# Patient Record
Sex: Female | Born: 1950
Health system: Southern US, Community
[De-identification: ages and names within clinical notes are randomized; demographics above are authoritative.]

## PROBLEM LIST (undated history)

## (undated) DIAGNOSIS — I10 Essential (primary) hypertension: Secondary | ICD-10-CM

## (undated) HISTORY — PX: PARTIAL HYSTERECTOMY: SHX80

## (undated) HISTORY — DX: Essential (primary) hypertension: I10

---

## 2001-10-26 ENCOUNTER — Other Ambulatory Visit: Admission: RE | Admit: 2001-10-26 | Discharge: 2001-10-26 | Payer: Self-pay | Admitting: Family Medicine

## 2003-03-20 ENCOUNTER — Other Ambulatory Visit: Admission: RE | Admit: 2003-03-20 | Discharge: 2003-03-20 | Payer: Self-pay | Admitting: Family Medicine

## 2003-04-10 ENCOUNTER — Inpatient Hospital Stay (HOSPITAL_COMMUNITY): Admission: AC | Admit: 2003-04-10 | Discharge: 2003-04-12 | Payer: Self-pay

## 2003-07-30 ENCOUNTER — Emergency Department (HOSPITAL_COMMUNITY): Admission: EM | Admit: 2003-07-30 | Discharge: 2003-07-30 | Payer: Self-pay | Admitting: Emergency Medicine

## 2005-09-16 ENCOUNTER — Other Ambulatory Visit: Admission: RE | Admit: 2005-09-16 | Discharge: 2005-09-16 | Payer: Self-pay | Admitting: Family Medicine

## 2007-09-08 ENCOUNTER — Encounter: Admission: RE | Admit: 2007-09-08 | Discharge: 2007-09-08 | Payer: Self-pay | Admitting: Family Medicine

## 2007-09-21 ENCOUNTER — Other Ambulatory Visit: Admission: RE | Admit: 2007-09-21 | Discharge: 2007-09-21 | Payer: Self-pay | Admitting: Family Medicine

## 2007-11-14 ENCOUNTER — Ambulatory Visit: Payer: Self-pay | Admitting: Family Medicine

## 2007-11-14 DIAGNOSIS — I10 Essential (primary) hypertension: Secondary | ICD-10-CM | POA: Insufficient documentation

## 2007-11-14 DIAGNOSIS — E119 Type 2 diabetes mellitus without complications: Secondary | ICD-10-CM | POA: Insufficient documentation

## 2007-11-14 DIAGNOSIS — R011 Cardiac murmur, unspecified: Secondary | ICD-10-CM

## 2007-12-19 ENCOUNTER — Ambulatory Visit: Payer: Self-pay | Admitting: Family Medicine

## 2007-12-21 LAB — CONVERTED CEMR LAB
ALT: 23 units/L (ref 0–35)
AST: 21 units/L (ref 0–37)
Albumin: 4.1 g/dL (ref 3.5–5.2)
BUN: 10 mg/dL (ref 6–23)
CO2: 30 meq/L (ref 19–32)
Chloride: 106 meq/L (ref 96–112)
Cholesterol: 180 mg/dL (ref 0–200)
Creatinine, Ser: 0.7 mg/dL (ref 0.4–1.2)
Creatinine,U: 314.8 mg/dL
GFR calc non Af Amer: 92 mL/min
HDL: 76.5 mg/dL (ref >39.0)
LDL Cholesterol: 87 mg/dL (ref 0–99)
Triglycerides: 81 mg/dL (ref 0–149)

## 2008-02-20 LAB — HM DIABETES EYE EXAM: HM Diabetic Eye Exam: NORMAL

## 2008-02-21 ENCOUNTER — Ambulatory Visit: Payer: Self-pay | Admitting: Family Medicine

## 2008-05-21 ENCOUNTER — Ambulatory Visit: Payer: Self-pay | Admitting: Family Medicine

## 2008-05-23 ENCOUNTER — Ambulatory Visit: Payer: Self-pay | Admitting: Family Medicine

## 2008-08-10 ENCOUNTER — Ambulatory Visit: Payer: Self-pay | Admitting: Family Medicine

## 2008-08-10 LAB — CONVERTED CEMR LAB
ALT: 20 units/L (ref 0–35)
AST: 19 units/L (ref 0–37)
Albumin: 4 g/dL (ref 3.5–5.2)
BUN: 12 mg/dL (ref 6–23)
CO2: 28 meq/L (ref 19–32)
Chloride: 105 meq/L (ref 96–112)
Cholesterol: 202 mg/dL — ABNORMAL HIGH (ref 0–200)
Glucose, Bld: 93 mg/dL (ref 70–99)
LDL Cholesterol: 105 mg/dL
Potassium: 3.7 meq/L (ref 3.5–5.1)
Total CHOL/HDL Ratio: 3
Total Protein: 7.3 g/dL (ref 6.0–8.3)

## 2008-08-28 ENCOUNTER — Ambulatory Visit: Payer: Self-pay | Admitting: Family Medicine

## 2008-08-28 DIAGNOSIS — E7800 Pure hypercholesterolemia, unspecified: Secondary | ICD-10-CM

## 2008-08-28 LAB — CONVERTED CEMR LAB
Cholesterol, target level: 200 mg/dL
LDL Goal: 100 mg/dL

## 2008-09-17 ENCOUNTER — Encounter: Payer: Self-pay | Admitting: Family Medicine

## 2008-09-20 ENCOUNTER — Encounter: Payer: Self-pay | Admitting: Family Medicine

## 2008-09-24 ENCOUNTER — Encounter (INDEPENDENT_AMBULATORY_CARE_PROVIDER_SITE_OTHER): Payer: Self-pay | Admitting: *Deleted

## 2008-09-24 DIAGNOSIS — M8580 Other specified disorders of bone density and structure, unspecified site: Secondary | ICD-10-CM

## 2008-12-21 ENCOUNTER — Ambulatory Visit: Payer: Self-pay | Admitting: Family Medicine

## 2008-12-21 LAB — CONVERTED CEMR LAB
Albumin: 4 g/dL (ref 3.5–5.2)
Alkaline Phosphatase: 70 units/L (ref 39–117)
Calcium: 9.2 mg/dL (ref 8.4–10.5)
GFR calc non Af Amer: 94.91 mL/min (ref >60)
Glucose, Bld: 114 mg/dL — ABNORMAL HIGH (ref 70–99)
HDL: 67.6 mg/dL (ref >39.00)
Hgb A1c MFr Bld: 6.3 % (ref 4.6–6.5)
Sodium: 142 meq/L (ref 135–145)
VLDL: 22 mg/dL (ref 0.0–40.0)

## 2008-12-26 ENCOUNTER — Ambulatory Visit: Payer: Self-pay | Admitting: Family Medicine

## 2009-05-21 ENCOUNTER — Ambulatory Visit: Payer: Self-pay | Admitting: Family Medicine

## 2009-05-22 LAB — CONVERTED CEMR LAB
ALT: 24 units/L (ref 0–35)
AST: 22 units/L (ref 0–37)
Alkaline Phosphatase: 60 units/L (ref 39–117)
BUN: 13 mg/dL (ref 6–23)
Bilirubin, Direct: 0 mg/dL (ref 0.0–0.3)
Chloride: 105 meq/L (ref 96–112)
Potassium: 4 meq/L (ref 3.5–5.1)
Sodium: 144 meq/L (ref 135–145)
Total Bilirubin: 0.4 mg/dL (ref 0.3–1.2)
Total CHOL/HDL Ratio: 3
VLDL: 22.8 mg/dL (ref 0.0–40.0)

## 2009-06-07 ENCOUNTER — Ambulatory Visit: Payer: Self-pay | Admitting: Family Medicine

## 2009-06-07 LAB — HM DIABETES FOOT EXAM

## 2009-06-13 ENCOUNTER — Encounter: Payer: Self-pay | Admitting: Family Medicine

## 2009-09-18 ENCOUNTER — Encounter: Payer: Self-pay | Admitting: Family Medicine

## 2009-09-20 LAB — HM MAMMOGRAPHY: HM Mammogram: NORMAL

## 2009-12-18 ENCOUNTER — Telehealth (INDEPENDENT_AMBULATORY_CARE_PROVIDER_SITE_OTHER): Payer: Self-pay | Admitting: *Deleted

## 2009-12-20 ENCOUNTER — Ambulatory Visit: Payer: Self-pay | Admitting: Family Medicine

## 2009-12-20 LAB — CONVERTED CEMR LAB
Albumin: 3.9 g/dL (ref 3.5–5.2)
Alkaline Phosphatase: 66 units/L (ref 39–117)
Bilirubin, Direct: 0 mg/dL (ref 0.0–0.3)
Calcium: 9.4 mg/dL (ref 8.4–10.5)
Creatinine, Ser: 0.7 mg/dL (ref 0.4–1.2)
GFR calc non Af Amer: 103.48 mL/min (ref >60)
HDL: 69 mg/dL (ref >39.00)
LDL Cholesterol: 105 mg/dL — ABNORMAL HIGH (ref 0–99)
Sodium: 141 meq/L (ref 135–145)
Total CHOL/HDL Ratio: 3
VLDL: 21.2 mg/dL (ref 0.0–40.0)

## 2010-01-01 ENCOUNTER — Ambulatory Visit: Payer: Self-pay | Admitting: Family Medicine

## 2010-01-01 LAB — HM SIGMOIDOSCOPY

## 2010-01-01 LAB — HM PAP SMEAR

## 2010-03-15 ENCOUNTER — Encounter: Payer: Self-pay | Admitting: Family Medicine

## 2010-03-17 ENCOUNTER — Encounter: Payer: Self-pay | Admitting: Family Medicine

## 2010-03-25 ENCOUNTER — Encounter: Payer: Self-pay | Admitting: Family Medicine

## 2010-03-25 NOTE — Progress Notes (Signed)
----   Converted from flag ---- ---- 12/17/2009 11:17 PM, Kerby Nora MD wrote: CMET, Lipids, Dx 272.0  ---- 12/12/2009 11:52 AM, Liane Comber CMA (AAMA) wrote: Lab orders please! Good Morning! This pt is scheduled for cpx labs Friday, which labs to draw and dx codes to use? Thanks Tasha ------------------------------

## 2010-03-25 NOTE — Assessment & Plan Note (Signed)
Summary: F/U/CLE   Vital Signs:  Patient profile:   60 year old female Weight:      173 pounds Temp:     98.1 degrees F oral Pulse rate:   64 / minute Pulse rhythm:   regular BP sitting:   130 / 80  (left arm) Cuff size:   regular  Vitals Entered By: Lowella Petties CMA (June 07, 2009 4:11 PM) CC: follow-up visit, Hypertension Management, Lipid Management   History of Present Illness: DM, well controlled on current regimen.   Hypertension History:      She denies headache, chest pain, palpitations, dyspnea with exertion, orthopnea, peripheral edema, neurologic problems, and side effects from treatment.  Well controlled. Marland Kitchen        Positive major cardiovascular risk factors include female age 50 years old or older, diabetes, hyperlipidemia, and hypertension.  Negative major cardiovascular risk factors include non-tobacco-user status.    Lipid Management History:      Positive NCEP/ATP III risk factors include female age 82 years old or older, diabetes, and hypertension.  Negative NCEP/ATP III risk factors include HDL cholesterol greater than 60 and non-tobacco-user status.        Her compliance with the TLC diet is poor.  The patient expresses understanding of adjunctive measures for cholesterol lowering.  Adjunctive measures started by the patient include aerobic exercise, fiber, and weight reduction.      Problems Prior to Update: 1)  Osteopenia  (ICD-733.90) 2)  Preventive Health Care  (ICD-V70.0) 3)  Special Screening For Osteoporosis  (ICD-V82.81) 4)  Other Screening Mammogram  (ICD-V76.12) 5)  Hypercholesterolemia  (ICD-272.0) 6)  Diabetes Mellitus, Type II  (ICD-250.00) 7)  Cardiac Murmur, Benign  (ICD-785.2) 8)  Hypertension  (ICD-401.9)  Current Medications (verified): 1)  Adult Aspirin Low Strength 81 Mg Tbdp (Aspirin) .... Take One By Mouth Daily 2)  Lisinopril-Hydrochlorothiazide 20-12.5 Mg Tabs (Lisinopril-Hydrochlorothiazide) .Marland Kitchen.. 1 Tab By Mouth Daily 3)   Multi-Vitamin  Tabs (Multiple Vitamin) .... Take One By Mouth Dialy 4)  Calcium Carbonate-Vitamin D 600-400 Mg-Unit  Tabs (Calcium Carbonate-Vitamin D) .... Take 1 Tablet By Mouth Once A Day 5)  Fish Oil   Oil (Fish Oil) .... Take 1 Capsule By Mouth Once A Day 6)  Accu Check Aviva .... Test Strips To Check Blood Sugar Daily Dx 250.00 7)  Alendronate Sodium 70 Mg Tabs (Alendronate Sodium) .Marland Kitchen.. 1 Tab By Mouth Every Week  Allergies (verified): No Known Drug Allergies  Past History:  Past medical, surgical, family and social histories (including risk factors) reviewed, and no changes noted (except as noted below).  Past Medical History: Reviewed history from 11/14/2007 and no changes required. Hypertension  Past Surgical History: Reviewed history from 11/14/2007 and no changes required. Hysterectomy partial, has left ovary: tubal pregnancy  Family History: Reviewed history from 11/14/2007 and no changes required. father: CAD, MI age 51, HTN, DVT mother:  DV, HTN, osteoarthritis siblings:HTN sisteR: heart palpitations no cencer  Social History: Reviewed history from 11/14/2007 and no changes required. Occupation: teaches kindergarten Married no children Never Smoked Alcohol use-no Drug use-no Regular exercise-yes, walks daily Diet: fruits and veggies  Review of Systems General:  Denies fatigue and fever. CV:  Denies chest pain or discomfort. Resp:  Denies shortness of breath. GI:  Denies abdominal pain, bloody stools, constipation, and diarrhea. GU:  Denies dysuria.  Physical Exam  General:  Overweight appearing female in NAD Mouth:  Oral mucosa and oropharynx without lesions or exudates.  Teeth in good repair.  Neck:  no carotid bruit or thyromegaly no cervical or supraclavicular lymphadenopathy  Lungs:  Normal respiratory effort, chest expands symmetrically. Lungs are clear to auscultation, no crackles or wheezes. Heart:  Normal rate and regular rhythm. S1 and S2  normal without gallop, murmur, click, rub or other extra sounds. Abdomen:  Bowel sounds positive,abdomen soft and non-tender without masses, organomegaly or hernias noted. Pulses:  R and L posterior tibial pulses are full and equal bilaterally  Extremities:  noedema  Diabetes Management Exam:    Foot Exam (with socks and/or shoes not present):       Sensory-Pinprick/Light touch:          Left medial foot (L-4): normal          Left dorsal foot (L-5): normal          Left lateral foot (S-1): normal          Right medial foot (L-4): normal          Right dorsal foot (L-5): normal          Right lateral foot (S-1): normal       Sensory-Monofilament:          Left foot: normal          Right foot: normal       Inspection:          Left foot: normal          Right foot: normal       Nails:          Left foot: normal          Right foot: normal   Impression & Recommendations:  Problem # 1:  DIABETES MELLITUS, TYPE II (ICD-250.00)  Well controlled with lifestyle.  Her updated medication list for this problem includes:    Adult Aspirin Low Strength 81 Mg Tbdp (Aspirin) .Marland Kitchen... Take one by mouth daily    Lisinopril-hydrochlorothiazide 20-12.5 Mg Tabs (Lisinopril-hydrochlorothiazide) .Marland Kitchen... 1 tab by mouth daily  Labs Reviewed: Creat: 0.7 (05/21/2009)     Last Eye Exam: normal (02/20/2008) Reviewed HgBA1c results: 6.3 (05/21/2009)  6.3 (12/21/2008)  Problem # 2:  HYPERCHOLESTEROLEMIA (ICD-272.0) Inadequate control...make diet and lifestyle cahnges, regular exercise and start red ueast rice 2400 mg daily. Goal LDL <100.  Labs Reviewed: SGOT: 22 (05/21/2009)   SGPT: 24 (05/21/2009)  Lipid Goals: Chol Goal: 200 (08/28/2008)   HDL Goal: 40 (08/28/2008)   LDL Goal: 100 (08/28/2008)   TG Goal: 150 (08/28/2008)  10 Yr Risk Heart Disease: 11 % Prior 10 Yr Risk Heart Disease: 7 % (12/26/2008)   HDL:74.40 (05/21/2009), 67.60 (12/21/2008)  LDL:105 (12/21/2008), 105 (08/10/2008)  Chol:201  (05/21/2009), 195 (12/21/2008)  Trig:114.0 (05/21/2009), 110.0 (12/21/2008)  Problem # 3:  HYPERTENSION (ICD-401.9) Well controlled. Continue current medication.  Her updated medication list for this problem includes:    Lisinopril-hydrochlorothiazide 20-12.5 Mg Tabs (Lisinopril-hydrochlorothiazide) .Marland Kitchen... 1 tab by mouth daily  BP today: 130/80 Prior BP: 110/70 (12/26/2008)  10 Yr Risk Heart Disease: 11 % Prior 10 Yr Risk Heart Disease: 7 % (12/26/2008)  Labs Reviewed: K+: 4.0 (05/21/2009) Creat: : 0.7 (05/21/2009)   Chol: 201 (05/21/2009)   HDL: 74.40 (05/21/2009)   LDL: 105 (12/21/2008)   TG: 114.0 (05/21/2009)  Complete Medication List: 1)  Adult Aspirin Low Strength 81 Mg Tbdp (Aspirin) .... Take one by mouth daily 2)  Lisinopril-hydrochlorothiazide 20-12.5 Mg Tabs (Lisinopril-hydrochlorothiazide) .Marland Kitchen.. 1 tab by mouth daily 3)  Multi-vitamin Tabs (Multiple vitamin) .... Take one by mouth  dialy 4)  Calcium Carbonate-vitamin D 600-400 Mg-unit Tabs (Calcium carbonate-vitamin d) .... Take 1 tablet by mouth once a day 5)  Fish Oil Oil (Fish oil) .... Take 1 capsule by mouth once a day 6)  Accu Check Aviva  .... Test strips to check blood sugar daily dx 250.00 7)  Alendronate Sodium 70 Mg Tabs (Alendronate sodium) .Marland Kitchen.. 1 tab by mouth every week 8)  Accu-chek Multiclix Lancet Dev Kit (Lancets misc.) .... Use as directed  Hypertension Assessment/Plan:      The patient's hypertensive risk group is category C: Target organ damage and/or diabetes.  Her calculated 10 year risk of coronary heart disease is 11 %.  Today's blood pressure is 130/80.  Her blood pressure goal is < 130/80.  Lipid Assessment/Plan:      Based on NCEP/ATP III, the patient's risk factor category is "history of diabetes".  The patient's lipid goals are as follows: Total cholesterol goal is 200; LDL cholesterol goal is 100; HDL cholesterol goal is 40; Triglyceride goal is 150.  Her LDL cholesterol goal has not been met.   Secondary causes for hyperlipidemia have been ruled out.  She has been counseled on adjunctive measures for lowering her cholesterol and has been provided with dietary instructions.    Patient Instructions: 1)  Work on  exercise, weight loss, healthy eating habits, low cholesterol diet. 2)   Start red yeast rice 2400 mg daily for cholesterol.  3)  Schedule CPX in 3 months  4)  BMP prior to visit, ICD-9: 250.00 5)  Hepatic Panel prior to visit ICD-9:  6)  HgBA1c prior to visit  ICD-9:  7)  Urine Microalbumin prior to visit ICD-9 :  Prescriptions: LISINOPRIL-HYDROCHLOROTHIAZIDE 20-12.5 MG TABS (LISINOPRIL-HYDROCHLOROTHIAZIDE) 1 tab by mouth daily  #90 x 3   Entered and Authorized by:   Kerby Nora MD   Signed by:   Kerby Nora MD on 06/07/2009   Method used:   [facility]        [facility] [id number]ret[facility]       [address]       [place], [place]  [address]       Ph: [phone] or [phone]       Fax: [phone]   RxID:   [id number]   Prior Medications (reviewed today): ADULT ASPIRIN LOW STRENGTH 81 MG TBDP (ASPIRIN) take one by mouth daily LISINOPRIL-HYDROCHLOROTHIAZIDE 20-12.5 MG TABS (LISINOPRIL-HYDROCHLOROTHIAZIDE) 1 tab by mouth daily MULTI-VITAMIN  TABS (MULTIPLE VITAMIN) take one by mouth dialy CALCIUM CARBONATE-VITAMIN D 600-400 MG-UNIT  TABS (CALCIUM CARBONATE-VITAMIN D) Take 1 tablet by mouth once a day FISH OIL   OIL (FISH OIL) Take 1 capsule by mouth once a day ACCU CHECK AVIVA () Test strips to check blood sugar daily Dx 250.00 ALENDRONATE SODIUM 70 MG TABS (ALENDRONATE SODIUM) 1 tab by mouth every week ACCU-CHEK MULTICLIX LANCET DEV  KIT (LANCETS MISC.) Use as directed Current Allergies (reviewed today): No known allergies

## 2010-03-25 NOTE — Miscellaneous (Signed)
Summary: lancet devise kit  Clinical Lists Changes  Medications: Added new medication of ACCU-CHEK MULTICLIX LANCET DEV  KIT (LANCETS MISC.) Use as directed - Signed Rx of ACCU-CHEK MULTICLIX LANCET DEV  KIT (LANCETS MISC.) Use as directed;  #1 x 0;  Signed;  Entered by: Lowella Petties CMA;  Authorized by: Kerby Nora MD;  Method used: [facility] [place], [place]  [address], Ph: [phone] or [phone], Fax: [phone]    Prescriptions: ACCU-CHEK MULTICLIX LANCET DEV  KIT (LANCETS MISC.) Use as directed  #1 x 0   Entered by:   Lowella Petties CMA   Authorized by:   Kerby Nora MD   Signed by:   Lowella Petties CMA on 06/13/2009   Method used:   Telephoned to ...       [facility] [id number]       [address]       [place], [place]  [address]       Ph: [phone] or [phone]       Fax: [phone]   RxID:   [id number]   Prior Medications: ADULT ASPIRIN LOW STRENGTH 81 MG TBDP (ASPIRIN) take one by mouth daily LISINOPRIL-HYDROCHLOROTHIAZIDE 20-12.5 MG TABS (LISINOPRIL-HYDROCHLOROTHIAZIDE) 1 tab by mouth daily MULTI-VITAMIN  TABS (MULTIPLE VITAMIN) take one by mouth dialy CALCIUM CARBONATE-VITAMIN D 600-400 MG-UNIT  TABS (CALCIUM CARBONATE-VITAMIN D) Take 1 tablet by mouth once a day FISH OIL   OIL (FISH OIL) Take 1 capsule by mouth once a day ACCU CHECK AVIVA () Test strips to check blood sugar daily Dx 250.00 ALENDRONATE SODIUM 70 MG TABS (ALENDRONATE SODIUM) 1 tab by mouth every week Current Allergies: No known allergies

## 2010-03-25 NOTE — Assessment & Plan Note (Signed)
Summary: CPX FOR YEARLY PHYSICAL/JRR   Vital Signs:  Patient profile:   60 year old female Height:      62.25 inches Weight:      168.0 pounds BMI:     30.59 Temp:     98.6 degrees F oral Pulse rate:   72 / minute Pulse rhythm:   regular BP sitting:   110 / 70  (left arm) Cuff size:   regular  Vitals Entered By: Benny Lennert CMA Duncan Dull) (January 01, 2010 3:45 PM)  History of Present Illness: Chief complaint cpx  The patient is here for annual wellness exam and preventative care.     DM, well controlled per FBS at home and here..A1C was not added for some reason.   5 lb weight loss.. Diet changes and exercsie...walking daily  Osteopenia..per pt last bone density 2010.  On calciuma ndvit D and weight bearing exercise.   Hypertension History:      She denies headache, chest pain, palpitations, dyspnea with exertion, orthopnea, peripheral edema, neurologic problems, and syncope.  She notes no problems with any antihypertensive medication side effects.  On lisinopril HCTZ, well controlled.        Positive major cardiovascular risk factors include female age 49 years old or older, diabetes, hyperlipidemia, and hypertension.  Negative major cardiovascular risk factors include non-tobacco-user status.    Lipid Management History:      Positive NCEP/ATP III risk factors include female age 10 years old or older, diabetes, and hypertension.  Negative NCEP/ATP III risk factors include HDL cholesterol greater than 60 and non-tobacco-user status.        Her compliance with the TLC diet is poor.  The patient denies any symptoms to suggest myopathy or liver disease.  Comments: Almost at goal on fish oil. .    Problems Prior to Update: 1)  Osteopenia  (ICD-733.90) 2)  Preventive Health Care  (ICD-V70.0) 3)  Special Screening For Osteoporosis  (ICD-V82.81) 4)  Other Screening Mammogram  (ICD-V76.12) 5)  Hypercholesterolemia  (ICD-272.0) 6)  Diabetes Mellitus, Type II  (ICD-250.00) 7)   Cardiac Murmur, Benign  (ICD-785.2) 8)  Hypertension  (ICD-401.9)  Current Medications (verified): 1)  Adult Aspirin Low Strength 81 Mg Tbdp (Aspirin) .... Take One By Mouth Daily 2)  Lisinopril-Hydrochlorothiazide 20-12.5 Mg Tabs (Lisinopril-Hydrochlorothiazide) .Marland Kitchen.. 1 Tab By Mouth Daily 3)  Multi-Vitamin  Tabs (Multiple Vitamin) .... Take One By Mouth Dialy 4)  Calcium Carbonate-Vitamin D 600-400 Mg-Unit  Tabs (Calcium Carbonate-Vitamin D) .... Take 1 Tablet By Mouth Once A Day 5)  Fish Oil   Oil (Fish Oil) .... Take 1 Capsule By Mouth Once A Day 6)  Accu Check Aviva .... Test Strips To Check Blood Sugar Daily Dx 250.00 7)  Alendronate Sodium 70 Mg Tabs (Alendronate Sodium) .Marland Kitchen.. 1 Tab By Mouth Every Week 8)  Accu-Chek Multiclix Lancet Dev  Kit (Lancets Misc.) .... Use As Directed 9)  Odor Free Garlic 100 Mg Tabs (Garlic) .... One Tablet Daily  Allergies (verified): No Known Drug Allergies  Past History:  Past medical, surgical, family and social histories (including risk factors) reviewed, and no changes noted (except as noted below).  Past Medical History: Reviewed history from 11/14/2007 and no changes required. Hypertension  Past Surgical History: Reviewed history from 11/14/2007 and no changes required. Hysterectomy partial, has left ovary: tubal pregnancy  Family History: Reviewed history from 11/14/2007 and no changes required. father: CAD, MI age 47, HTN, DVT mother:  DV, HTN, osteoarthritis siblings:HTN sisteR: heart  palpitations no cencer  Social History: Reviewed history from 11/14/2007 and no changes required. Occupation: teaches kindergarten Married no children Never Smoked Alcohol use-no Drug use-no Regular exercise-yes, walks daily Diet: fruits and veggies  Review of Systems General:  Denies fatigue and fever. CV:  Denies chest pain or discomfort. Resp:  Denies shortness of breath. GI:  Denies abdominal pain, bloody stools, constipation, and  diarrhea. GU:  Denies abnormal vaginal bleeding, dysuria, and hematuria.   Impression & Recommendations:  Problem # 1:  PREVENTIVE HEALTH CARE (ICD-V70.0) The patient's preventative maintenance and recommended screening tests for an annual wellness exam were reviewed in full today. Brought up to date unless services declined.  Counselled on the importance of diet, exercise, and its role in overall health and mortality. The patient's FH and SH was reviewed, including their home life, tobacco status, and drug and alcohol status.   DVE no pap.     Problem # 2:  DIABETES MELLITUS, TYPE II (ICD-250.00) Well controlled with current lifestyle Her updated medication list for this problem includes:    Adult Aspirin Low Strength 81 Mg Tbdp (Aspirin) .Marland Kitchen... Take one by mouth daily    Lisinopril-hydrochlorothiazide 20-12.5 Mg Tabs (Lisinopril-hydrochlorothiazide) .Marland Kitchen... 1 tab by mouth daily  Problem # 3:  HYPERCHOLESTEROLEMIA (ICD-272.0) Well controlled. Continue current medication.   Problem # 4:  HYPERTENSION (ICD-401.9) Well controlled. Continue current medication.  Her updated medication list for this problem includes:    Lisinopril-hydrochlorothiazide 20-12.5 Mg Tabs (Lisinopril-hydrochlorothiazide) .Marland Kitchen... 1 tab by mouth daily  Problem # 5:  ROUTINE GYNECOLOGICAL EXAMINATION (ICD-V72.31) DVE no pap.   Complete Medication List: 1)  Adult Aspirin Low Strength 81 Mg Tbdp (Aspirin) .... Take one by mouth daily 2)  Lisinopril-hydrochlorothiazide 20-12.5 Mg Tabs (Lisinopril-hydrochlorothiazide) .Marland Kitchen.. 1 tab by mouth daily 3)  Multi-vitamin Tabs (Multiple vitamin) .... Take one by mouth dialy 4)  Calcium Carbonate-vitamin D 600-400 Mg-unit Tabs (Calcium carbonate-vitamin d) .... Take 1 tablet by mouth once a day 5)  Fish Oil Oil (Fish oil) .... Take 1 capsule by mouth once a day 6)  Accu Check Aviva  .... Test strips to check blood sugar daily dx 250.00 7)  Alendronate Sodium 70 Mg Tabs  (Alendronate sodium) .Marland Kitchen.. 1 tab by mouth every week 8)  Accu-chek Multiclix Lancet Dev Kit (Lancets misc.) .... Use as directed 9)  Odor Free Garlic 100 Mg Tabs (Garlic) .... One tablet daily 10)  Freestyle Lite Test Strp (Glucose blood) .... Check blood sugar daily dx 250.00  Hypertension Assessment/Plan:      The patient's hypertensive risk group is category C: Target organ damage and/or diabetes.  Her calculated 10 year risk of coronary heart disease is 7 %.  Today's blood pressure is 110/70.  Her blood pressure goal is < 130/80.  Lipid Assessment/Plan:      Based on NCEP/ATP III, the patient's risk factor category is "history of diabetes".  The patient's lipid goals are as follows: Total cholesterol goal is 200; LDL cholesterol goal is 100; HDL cholesterol goal is 40; Triglyceride goal is 150.  Her LDL cholesterol goal has not been met.  Secondary causes for hyperlipidemia have been ruled out.  She has been counseled on adjunctive measures for lowering her cholesterol and has been provided with dietary instructions.    Patient Instructions: 1)  Please schedule a follow-up appointment in 6 months .  2)  BMP prior to visit, ICD-9: Dx 250.00 3)  Hepatic Panel prior to visit ICD-9:  4)  Lipid panel  prior to visit ICD-9 :  5)  HgBA1c prior to visit  ICD-9:  Prescriptions: FREESTYLE LITE TEST  STRP (GLUCOSE BLOOD) Check blood sugar daily Dx 250.00  #100 x 11   Entered and Authorized by:   Kerby Nora MD   Signed by:   Kerby Nora MD on 01/01/2010   Method used:   [facility]        [facility] [id number]       [address]       [place], [place]  [address]       Ph: [phone] or [phone]       Fax: [phone]   RxID:   [id number]    Orders Added: 1)  Est. Patient 40-64 years [[id number]]    Current Allergies (reviewed today): No known allergies    Past Medical History:    Reviewed history from 11/14/2007 and no changes required:        Hypertension  Past Surgical History:    Reviewed history from 11/14/2007 and no changes required:       Hysterectomy partial, has left ovary: tubal pregnancy   Last Flu Vaccine:  refused (11/14/2007 1:54:45 PM) Flu Vaccine Next Due:  Refused Last TD:  Tdap (08/28/2008 12:10:10 PM) TD Next Due:  10 yr Flex Sig Next Due:  Not Indicated Colonoscopy Result Date:  02/24/2004 Colonoscopy Result:  normal Colonoscopy Next Due:  10 yr Hemoccult Next Due:  Not Indicated Last PAP:  Normal (07/25/2007 2:25:59 PM) PAP Result Date:  01/01/2010 PAP Result:  DVE no pap

## 2010-04-10 NOTE — Letter (Signed)
Summary: Historic Patient File  Historic Patient File   Imported By: Kassie Mends 04/01/2010 10:19:30  _____________________________________________________________________  External Attachment:    Type:   Image     Comment:   External Document

## 2010-06-06 ENCOUNTER — Other Ambulatory Visit: Payer: Self-pay | Admitting: Family Medicine

## 2010-07-08 ENCOUNTER — Other Ambulatory Visit: Payer: Self-pay | Admitting: Family Medicine

## 2010-07-08 DIAGNOSIS — E7800 Pure hypercholesterolemia, unspecified: Secondary | ICD-10-CM

## 2010-07-09 ENCOUNTER — Other Ambulatory Visit: Payer: Self-pay

## 2010-07-11 NOTE — H&P (Signed)
NAME:  Karla Ortega, Karla Ortega                ACCOUNT NO.:  [id number]   MEDICAL RECORD NO.:  [id number]                   PATIENT TYPE:  INP   LOCATION:  1823                                 FACILITY:  [facility]   PHYSICIAN:  Hewitt Shorts, M.D.            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  She  apparently struck her head and her fiancee, who was with her, reported a 2-3  minute loss of consciousness.  She was apparently brought by EMS to [facility] Emergency Room and evaluated by Dr. Cathren Laine, the  emergency room physician.  The patient's fiancee was not present, but two of  her sisters were present and helped to provide some additional history.   Dr. Denton Lank obtained a CT scan of the brain without contrast which revealed a  right calvarial skull fracture.  Dr. Jena Gauss interpreted a tiny epidural  hematoma but in fact on my review of the study, I could not even identify an  epidural hematoma, although the calvaria skull fracture is clearly apparent.   Dr. Denton Lank requested neurosurgical consultation.  I did inquire whether the  trauma surgery service had been consulted, and he said that they had not  been.  I asked whether he was planning on consulting them, and he said that  he could.  I said that if he felt comfortable clearing the patient from a  general trauma perspective, then a trauma surgery consultation was  unnecessary, and he explained that he would consult with the trauma surgeon,  Dr. Ovidio Kin.  Subsequently, Dr. Denton Lank explained that he did speak with  Dr. Ezzard Standing, who, according to Dr. Denton Lank, deferred to my judgment and  thereby  declined consultation.   The patient herself complains of headache and some nausea.  She denies any  vomiting, diplopia, blurred vision, or weakness.  She describes some right  upper back pain.   PAST MEDICAL HISTORY:  Notable for a history of hypertension.  Her primary  physician is Dr. Avis Epley.  She has been treated for hypertension for 5-6 years.  She denies any history of myocardial infarction, cancer, stroke, peptic  ulcer disease, diabetes, or lung disease.  Previous surgery includes tubal  ligation, hysterectomy, and appendectomy.  She denies allergies to  medications.  Her only current medication is a blood pressure pill.   FAMILY HISTORY:  Her mother is age 11 with a history of myocardial  infarction and hypertension.  Father died at age 72.  He had diabetes,  myocardial infarction, and suffered a deep vein thrombosis and resulting  pulmonary embolus from which he died.   SOCIAL HISTORY:  The  patient is widowed.  She is engaged.  She works as a  Midwife at [employer].  She does not smoke.  She does  drink an occasional alcoholic beverage.   REVIEW OF SYSTEMS:  Notable for difficulties described in history of present  illness and past medical history but is otherwise unremarkable.   PHYSICAL EXAMINATION:  GENERAL:  The patient is an obese black female in no  acute distress.  VITAL SIGNS:  Temperature 97.4, pulse 78, blood pressure 123/60, respiratory  rate 18.  LUNGS:  Clear to auscultation.  She has symmetric __________.  HEART:  Regular rate and rhythm.  Normal S1 and S2.  There is no murmur.  ABDOMEN:  Soft, nondistended, nontender.  Bowel sounds are present.  EXTREMITIES:  No clubbing, cyanosis, or edema.  MUSCULOSKELETAL:  No tenderness of the cervical or spinous process.  There  is some tenderness in the upper right perithoracic region.  NEUROLOGIC:  Mental status:  Patient awake, alert.  She is oriented to her  name, hospital, and April 10, 2003.   Her speech is fluent.  She has good  comprehension.  She follows commands.  Cranial nerves show pupils to be  equal, round, and reactive to light, about 3 mm bilaterally.  Extraocular  movements are intact.  Her facial movement is symmetrical.  Hearing is  present bilaterally.  Shoulder shrug is present bilaterally.  Tongue is  midline.  Motor examination shows 5/5 strength in the upper and lower  extremities.  She has no drift to the upper extremities.  Sensation is  intact to pinprick throughout the upper and lower extremities.  Reflexes are  2 at the biceps, brachialis, triceps, quadriceps, __________ symmetrical  bilaterally.  Toes are downgoing bilaterally.  Gait and stance were not  tested.   IMPRESSION:  Right calvarial skull fracture, concussion with loss of  consciousness less than 1 hour and a Glasgow Coma Scale of 15/15.   PLAN:  The patient will be admitted to the neurosurgical intensive care unit  for observation with neuro checks.  We will repeat her CT scan of the brain  without contrast tomorrow.  We will obtain CT scans of the cervical and  thoracic spine.  She will be kept NPO until the nausea resolves and be  supported with IV fluids.                                                Hewitt Shorts, M.D.    RWN/MEDQ  D:  04/10/2003  T:  04/10/2003  Job:  [id number]

## 2010-07-18 ENCOUNTER — Ambulatory Visit: Payer: Self-pay | Admitting: Family Medicine

## 2010-07-22 ENCOUNTER — Other Ambulatory Visit: Payer: Self-pay

## 2010-08-01 ENCOUNTER — Ambulatory Visit: Payer: Self-pay | Admitting: Family Medicine

## 2010-08-02 ENCOUNTER — Encounter: Payer: Self-pay | Admitting: Family Medicine

## 2010-08-06 ENCOUNTER — Other Ambulatory Visit (INDEPENDENT_AMBULATORY_CARE_PROVIDER_SITE_OTHER): Payer: BC Managed Care – PPO | Admitting: Family Medicine

## 2010-08-06 DIAGNOSIS — E119 Type 2 diabetes mellitus without complications: Secondary | ICD-10-CM

## 2010-08-06 DIAGNOSIS — E7800 Pure hypercholesterolemia, unspecified: Secondary | ICD-10-CM

## 2010-08-06 LAB — BASIC METABOLIC PANEL
BUN: 15 mg/dL (ref 6–23)
CO2: 30 mEq/L (ref 19–32)
Calcium: 9.3 mg/dL (ref 8.4–10.5)
Creatinine, Ser: 0.6 mg/dL (ref 0.4–1.2)

## 2010-08-06 LAB — LDL CHOLESTEROL, DIRECT: Direct LDL: 107.6 mg/dL

## 2010-08-06 LAB — HEPATIC FUNCTION PANEL
ALT: 18 U/L (ref 0–35)
Bilirubin, Direct: 0.1 mg/dL (ref 0.0–0.3)
Total Protein: 7.6 g/dL (ref 6.0–8.3)

## 2010-08-06 LAB — LIPID PANEL
Cholesterol: 211 mg/dL — ABNORMAL HIGH (ref 0–200)
HDL: 80 mg/dL (ref >39.00)
Triglycerides: 70 mg/dL (ref 0.0–149.0)

## 2010-08-08 ENCOUNTER — Encounter: Payer: Self-pay | Admitting: Family Medicine

## 2010-08-08 ENCOUNTER — Ambulatory Visit (INDEPENDENT_AMBULATORY_CARE_PROVIDER_SITE_OTHER): Payer: BC Managed Care – PPO | Admitting: Family Medicine

## 2010-08-08 DIAGNOSIS — E7800 Pure hypercholesterolemia, unspecified: Secondary | ICD-10-CM

## 2010-08-08 DIAGNOSIS — E119 Type 2 diabetes mellitus without complications: Secondary | ICD-10-CM

## 2010-08-08 DIAGNOSIS — I10 Essential (primary) hypertension: Secondary | ICD-10-CM

## 2010-08-08 NOTE — Patient Instructions (Addendum)
Restart red yeast rice. Work on Animator exercise, and weight loss, low carb diet.

## 2010-08-08 NOTE — Assessment & Plan Note (Signed)
Well controlled. Continue current medication.  

## 2010-08-08 NOTE — Assessment & Plan Note (Signed)
Almost at goal LDL <100. Recommend re-starting red yeast rice.

## 2010-08-08 NOTE — Assessment & Plan Note (Signed)
Likely well controlled, but due for A1C. WIll check today.  /Encouraged exercise, weight loss, healthy eating habits.

## 2010-08-08 NOTE — Progress Notes (Signed)
  Subjective:    Patient ID: Karla Ortega, female    DOB: [birth year], 60 y.o.   MRN: [id number]  HPI  Diabetes: For some reson A1C not ordered correctly with labs.. Due for this. Hypoglycemic episodes:None Hyperglycemic episodes:None Feet problems:None Blood Sugars averaging:93-106 eye exam within last year: yes   Hypertension:  Well controlled on lisinopril HCTZ  Using medication without problems or lightheadedness: Yes Chest pain with exertion:None Edema:None Short of breath:None Average home BPs: checking at pharm rarely, 120/70s Other issues:Dry hacking cough in last few days, no sneeze por allergy symptoms, no ST.  Elevated Cholesterol:Almost at goal LDL<100  On no medication.     Review of Systems  Constitutional: Negative for fever and fatigue.  HENT: Negative for ear pain.   Eyes: Negative for pain.  Respiratory: Negative for chest tightness and shortness of breath.   Cardiovascular: Negative for chest pain, palpitations and leg swelling.  Gastrointestinal: Negative for abdominal pain.  Genitourinary: Negative for dysuria.       Objective:   Physical Exam  Constitutional: Vital signs are normal. She appears well-developed and well-nourished. She is cooperative.  Non-toxic appearance. She does not appear ill. No distress.  HENT:  Head: Normocephalic.  Right Ear: Hearing, tympanic membrane, external ear and ear canal normal. Tympanic membrane is not erythematous, not retracted and not bulging.  Left Ear: Hearing, tympanic membrane, external ear and ear canal normal. Tympanic membrane is not erythematous, not retracted and not bulging.  Nose: No mucosal edema or rhinorrhea. Right sinus exhibits no maxillary sinus tenderness and no frontal sinus tenderness. Left sinus exhibits no maxillary sinus tenderness and no frontal sinus tenderness.  Mouth/Throat: Uvula is midline, oropharynx is clear and moist and mucous membranes are normal.  Eyes: Conjunctivae, EOM and  lids are normal. Pupils are equal, round, and reactive to light. No foreign bodies found.  Neck: Trachea normal and normal range of motion. Neck supple. Carotid bruit is not present. No mass and no thyromegaly present.  Cardiovascular: Normal rate, regular rhythm, S1 normal, S2 normal, normal heart sounds, intact distal pulses and normal pulses.  Exam reveals no gallop and no friction rub.   No murmur heard. Pulmonary/Chest: Effort normal and breath sounds normal. Not tachypneic. No respiratory distress. She has no decreased breath sounds. She has no wheezes. She has no rhonchi. She has no rales.  Abdominal: Soft. Normal appearance and bowel sounds are normal. There is no tenderness.  Neurological: She is alert.  Skin: Skin is warm, dry and intact. No rash noted.  Psychiatric: Her speech is normal and behavior is normal. Judgment and thought content normal. Her mood appears not anxious. Cognition and memory are normal. She does not exhibit a depressed mood.   Diabetic foot exam: Normal inspection No skin breakdown No calluses  Normal DP pulses Normal sensation to light touch and monofilament Nails normal         Assessment & Plan:

## 2010-08-09 LAB — HEMOGLOBIN A1C
Hgb A1c MFr Bld: 6.3 % — ABNORMAL HIGH (ref <5.7)
Mean Plasma Glucose: 134 mg/dL — ABNORMAL HIGH (ref <117)

## 2010-08-12 ENCOUNTER — Encounter: Payer: Self-pay | Admitting: *Deleted

## 2010-08-19 ENCOUNTER — Ambulatory Visit: Payer: Self-pay | Admitting: Family Medicine

## 2010-09-02 ENCOUNTER — Other Ambulatory Visit: Payer: Self-pay | Admitting: Family Medicine

## 2010-09-05 ENCOUNTER — Other Ambulatory Visit: Payer: Self-pay | Admitting: *Deleted

## 2010-09-05 ENCOUNTER — Ambulatory Visit: Payer: Self-pay | Admitting: Family Medicine

## 2010-09-05 MED ORDER — GLUCOSE BLOOD VI STRP: ORAL_STRIP | Status: AC

## 2010-09-05 NOTE — Telephone Encounter (Signed)
Pt needs new scripts for test strips, she has never used [facility] before so she will need printed scripts that she can mail in. Please give back to me and I will call her when ready, I have a form that she needs to mail in with scripts.

## 2010-09-05 NOTE — Telephone Encounter (Signed)
Advised pt scripts are ready to pick up.  Quantity changed to 300 on each with 3 refills on each.

## 2010-09-18 ENCOUNTER — Ambulatory Visit (INDEPENDENT_AMBULATORY_CARE_PROVIDER_SITE_OTHER): Payer: BC Managed Care – PPO | Admitting: Family Medicine

## 2010-09-18 ENCOUNTER — Encounter: Payer: Self-pay | Admitting: Family Medicine

## 2010-09-18 VITALS — BP 110/68 | HR 70 | Temp 98.5°F | Ht 63.5 in | Wt 171.8 lb

## 2010-09-18 DIAGNOSIS — R5383 Other fatigue: Secondary | ICD-10-CM

## 2010-09-18 DIAGNOSIS — R531 Weakness: Secondary | ICD-10-CM | POA: Insufficient documentation

## 2010-09-18 NOTE — Assessment & Plan Note (Signed)
EKG nml.  No suggestion of cardiac cause.  Most likly multi-factorial.. Due to heat, mild dehydration, pooling of blood in legs. MAy also be having lower blood sugars.. She will follow BP at home and call if low or dizziness.

## 2010-09-18 NOTE — Progress Notes (Signed)
  Subjective:    Patient ID: Karla Ortega, female    DOB: [birth year], 60 y.o.   MRN: [id number]  HPI  60 year old female with history of DM  1 week ago with pre-syncopal event after working outdoors, crouching and digging in ground. Started sweating after 10-15 minutes low back was aching.  As she was going back inside, she felt hungry.  Felt very weak, sagged to the ground.  No LOC. Did not check blood sugar then. Not on blood sugar medication. No associated  Chest pain, no SOB, no palpitations, no headache, no neuro changes like numbness, no weakness unilaterally.   Ate some crackers and ate some juice.  She had last eaten  3 hours earlier, regular lunch. FBS that AM was 96.  Has not not had any recent low Blood pressures known, not checking regulaly.   Review of Systems  Constitutional: Negative for fever and fatigue.  HENT: Negative for ear pain.   Eyes: Negative for pain.  Respiratory: Negative for chest tightness and shortness of breath.   Cardiovascular: Negative for chest pain, palpitations and leg swelling.  Gastrointestinal: Negative for abdominal pain.  Genitourinary: Negative for dysuria.       Objective:   Physical Exam  Constitutional: She is oriented to person, place, and time. Vital signs are normal. She appears well-developed and well-nourished. She is cooperative.  Non-toxic appearance. She does not appear ill. No distress.  HENT:  Head: Normocephalic.  Right Ear: Hearing, tympanic membrane, external ear and ear canal normal. Tympanic membrane is not erythematous, not retracted and not bulging.  Left Ear: Hearing, tympanic membrane, external ear and ear canal normal. Tympanic membrane is not erythematous, not retracted and not bulging.  Nose: No mucosal edema or rhinorrhea. Right sinus exhibits no maxillary sinus tenderness and no frontal sinus tenderness. Left sinus exhibits no maxillary sinus tenderness and no frontal sinus tenderness.  Mouth/Throat:  Uvula is midline, oropharynx is clear and moist and mucous membranes are normal.  Eyes: Conjunctivae, EOM and lids are normal. Pupils are equal, round, and reactive to light. No foreign bodies found.  Neck: Trachea normal and normal range of motion. Neck supple. Carotid bruit is not present. No mass and no thyromegaly present.  Cardiovascular: Normal rate, regular rhythm, S1 normal, S2 normal, normal heart sounds, intact distal pulses and normal pulses.  Exam reveals no gallop and no friction rub.   No murmur heard. Pulmonary/Chest: Effort normal and breath sounds normal. Not tachypneic. No respiratory distress. She has no decreased breath sounds. She has no wheezes. She has no rhonchi. She has no rales.  Abdominal: Normal appearance.  Musculoskeletal:       Lumbar back: Normal.  Neurological: She is alert and oriented to person, place, and time. She has normal strength and normal reflexes. No cranial nerve deficit or sensory deficit. She displays a negative Romberg sign. Coordination and gait normal. GCS eye subscore is 4. GCS verbal subscore is 5. GCS motor subscore is 6.  Skin: Skin is warm, dry and intact. No rash noted.  Psychiatric: Her speech is normal and behavior is normal. Judgment and thought content normal. Her mood appears not anxious. Cognition and memory are normal. She does not exhibit a depressed mood.          Assessment & Plan:

## 2010-09-18 NOTE — Patient Instructions (Addendum)
Follow BP at home.. Be alert for dizziness and low BPS. Call if occuring for decrease in BP measurement.  Push fluids, rest.  Genytle stretching and heat for back if pain recurs.

## 2010-09-25 ENCOUNTER — Telehealth: Payer: Self-pay | Admitting: *Deleted

## 2010-09-25 DIAGNOSIS — M949 Disorder of cartilage, unspecified: Secondary | ICD-10-CM

## 2010-09-25 NOTE — Telephone Encounter (Signed)
Pt has scheduled a DEXA at [facility] on [address] in [place].  Appt is for 8/14 and she needs [place] to send order.  Please advise.

## 2010-09-26 NOTE — Telephone Encounter (Signed)
Dexa order faxed to [facility].

## 2010-10-09 ENCOUNTER — Encounter: Payer: Self-pay | Admitting: Family Medicine

## 2010-10-13 ENCOUNTER — Encounter: Payer: Self-pay | Admitting: Family Medicine

## 2011-02-10 ENCOUNTER — Telehealth: Payer: Self-pay | Admitting: Family Medicine

## 2011-02-10 DIAGNOSIS — E7800 Pure hypercholesterolemia, unspecified: Secondary | ICD-10-CM

## 2011-02-10 DIAGNOSIS — E119 Type 2 diabetes mellitus without complications: Secondary | ICD-10-CM

## 2011-02-10 DIAGNOSIS — I10 Essential (primary) hypertension: Secondary | ICD-10-CM

## 2011-02-10 DIAGNOSIS — M899 Disorder of bone, unspecified: Secondary | ICD-10-CM

## 2011-02-10 NOTE — Telephone Encounter (Signed)
Message copied by Excell Seltzer on Tue Feb 10, 2011 10:19 AM ------      Message from: Alvina Chou      Created: Thu Feb 05, 2011  4:58 PM      Regarding: orders for 12-20       Patient is scheduled for CPX labs, please order future labs, Thanks , Camelia Eng

## 2011-02-12 ENCOUNTER — Encounter: Payer: BC Managed Care – PPO | Admitting: Family Medicine

## 2011-02-12 ENCOUNTER — Other Ambulatory Visit: Payer: BC Managed Care – PPO

## 2011-06-06 ENCOUNTER — Other Ambulatory Visit: Payer: Self-pay | Admitting: Family Medicine

## 2011-09-04 ENCOUNTER — Encounter: Payer: BC Managed Care – PPO | Admitting: Family Medicine

## 2011-09-10 ENCOUNTER — Other Ambulatory Visit: Payer: Self-pay | Admitting: *Deleted

## 2011-09-10 MED ORDER — ALENDRONATE SODIUM 70 MG PO TABS: 70.0000 mg | ORAL_TABLET | ORAL | Status: DC

## 2011-10-12 ENCOUNTER — Other Ambulatory Visit: Payer: Self-pay | Admitting: *Deleted

## 2011-10-12 NOTE — Telephone Encounter (Signed)
Patient not seen in over 1 year for cpx okay to refill?

## 2011-10-13 ENCOUNTER — Other Ambulatory Visit: Payer: Self-pay

## 2011-10-13 NOTE — Telephone Encounter (Signed)
Mak eappt for CPX, refill until then.

## 2011-10-13 NOTE — Telephone Encounter (Signed)
[facility] left v/m requesting refill Fosamax. Pt last seen 09/18/10; no appt scheduled.Please advise.

## 2011-10-13 NOTE — Telephone Encounter (Signed)
Patients husband advised patient needs appt before we can refill meds

## 2011-10-13 NOTE — Telephone Encounter (Signed)
Make appt for CPX, then fill until then.

## 2011-10-24 LAB — HM DIABETES EYE EXAM

## 2011-11-27 ENCOUNTER — Other Ambulatory Visit: Payer: Self-pay | Admitting: Family Medicine

## 2011-11-30 NOTE — Telephone Encounter (Signed)
Received refill request electronically from pharmacy. Medication is not on current medication list. Last office visit 09/18/10. Is it okay to refill medication?

## 2011-12-01 NOTE — Telephone Encounter (Signed)
Pt has appt 12/13

## 2011-12-01 NOTE — Telephone Encounter (Signed)
needs appt for CPX, refill until then

## 2012-01-04 ENCOUNTER — Encounter: Payer: Self-pay | Admitting: Family Medicine

## 2012-01-05 ENCOUNTER — Encounter: Payer: Self-pay | Admitting: *Deleted

## 2012-01-12 ENCOUNTER — Encounter: Payer: Self-pay | Admitting: Family Medicine

## 2012-02-15 ENCOUNTER — Encounter: Payer: BC Managed Care – PPO | Admitting: Family Medicine

## 2012-02-15 ENCOUNTER — Other Ambulatory Visit: Payer: Self-pay

## 2012-02-15 NOTE — Telephone Encounter (Signed)
Refill as requested and can work pt into 30 min slot.

## 2012-02-15 NOTE — Telephone Encounter (Signed)
Pt request refill glucose test strip to [facility]. Pt was scheduled for CPX today but had to leave before seen due to "family emergency". Pt request CPX prior to 02/29/12 so she will not miss work to get CPX. No CPX appts available; if there is a 30 min appt can that be used for CPX.Please advise.Pt last seen 09-18-10.

## 2012-02-16 MED ORDER — GLUCOSE BLOOD VI STRP: ORAL_STRIP | Status: DC

## 2012-02-23 ENCOUNTER — Telehealth: Payer: Self-pay | Admitting: Family Medicine

## 2012-02-23 ENCOUNTER — Encounter: Payer: Self-pay | Admitting: Family Medicine

## 2012-02-23 ENCOUNTER — Ambulatory Visit (INDEPENDENT_AMBULATORY_CARE_PROVIDER_SITE_OTHER): Payer: BC Managed Care – PPO | Admitting: Family Medicine

## 2012-02-23 VITALS — BP 116/70 | HR 60 | Temp 98.5°F | Ht 62.5 in | Wt 170.5 lb

## 2012-02-23 DIAGNOSIS — I10 Essential (primary) hypertension: Secondary | ICD-10-CM

## 2012-02-23 DIAGNOSIS — E7800 Pure hypercholesterolemia, unspecified: Secondary | ICD-10-CM

## 2012-02-23 DIAGNOSIS — Z0000 Encounter for general adult medical examination without abnormal findings: Secondary | ICD-10-CM

## 2012-02-23 DIAGNOSIS — E119 Type 2 diabetes mellitus without complications: Secondary | ICD-10-CM

## 2012-02-23 DIAGNOSIS — M899 Disorder of bone, unspecified: Secondary | ICD-10-CM

## 2012-02-23 DIAGNOSIS — M949 Disorder of cartilage, unspecified: Secondary | ICD-10-CM

## 2012-02-23 LAB — COMPREHENSIVE METABOLIC PANEL
ALT: 17 U/L (ref 0–35)
Albumin: 4.1 g/dL (ref 3.5–5.2)
CO2: 27 mEq/L (ref 19–32)
Calcium: 9.9 mg/dL (ref 8.4–10.5)
Chloride: 101 mEq/L (ref 96–112)
GFR: 99.61 mL/min (ref >60.00)
Glucose, Bld: 99 mg/dL (ref 70–99)
Sodium: 137 mEq/L (ref 135–145)
Total Bilirubin: 0.5 mg/dL (ref 0.3–1.2)
Total Protein: 8 g/dL (ref 6.0–8.3)

## 2012-02-23 LAB — HM DIABETES FOOT EXAM

## 2012-02-23 LAB — HEMOGLOBIN A1C: Hgb A1c MFr Bld: 6.8 % — ABNORMAL HIGH (ref 4.6–6.5)

## 2012-02-23 LAB — LIPID PANEL
Total CHOL/HDL Ratio: 3
Triglycerides: 181 mg/dL — ABNORMAL HIGH (ref 0.0–149.0)

## 2012-02-23 NOTE — Patient Instructions (Addendum)
Stop by lab on your way out. Look into pneumonia and shingles vaccine. Call if interested. Work on exercise, weight loss and healthy eating.

## 2012-02-23 NOTE — Telephone Encounter (Signed)
Message copied by Excell Seltzer on Tue Feb 23, 2012  2:13 PM ------      Message from: Baldomero Lamy      Created: Tue Feb 23, 2012 11:07 AM      Regarding: pt wants to change diagnosis code       FYI Pt came over for labs, she was on her AVS that it said she was diabetic. Pt wants to  Know if that can be changed she says she is not diabetic, she is pre diabetic.

## 2012-02-23 NOTE — Addendum Note (Signed)
Addended by: Liane Comber C on: 02/23/2012 11:07 AM   Modules accepted: Orders

## 2012-02-23 NOTE — Telephone Encounter (Signed)
Patient notified as instructed by telephone. Pt voiced understanding.  

## 2012-02-23 NOTE — Progress Notes (Signed)
Subjective:    Patient ID: Karla Ortega, female    DOB: [birth year], 61 y.o.   MRN: [id number]  HPI  The patient is here for annual wellness exam and preventative care.     Hypertension:   Well controlled on lisinopril HCTZ.  Using medication without problems or lightheadedness: None Chest pain with exertion:None Edema:None Short of breath:None Average home BPs: 115/70 Other issues:  Diabetes:  Due for re-eval. On no med. Lab Results  Component Value Date   HGBA1C 6.3* 08/08/2010  Using medications without difficulties: Hypoglycemic episodes:None Hyperglycemic episodes:None Feet problems: None Blood Sugars averaging: FBS 95-105 eye exam within last year:Yes  Elevated Cholesterol: Has stopped red yeast rice, no problem just ran out. Due for labs re-eval. Lab Results  Component Value Date   CHOL 211* 08/06/2010   HDL 80.00 08/06/2010   LDLCALC 105* 12/20/2009   LDLDIRECT 107.6 08/06/2010   TRIG 70.0 08/06/2010   CHOLHDL 3 08/06/2010  Diet compliance: Good, sticking to DM diet Exercise:walking occ Other complaints:   Review of Systems  Constitutional: Negative for fever, fatigue and unexpected weight change.  HENT: Negative for ear pain, congestion, sore throat, sneezing, trouble swallowing and sinus pressure.   Eyes: Negative for pain and itching.  Respiratory: Negative for cough, shortness of breath and wheezing.   Cardiovascular: Negative for chest pain, palpitations and leg swelling.  Gastrointestinal: Negative for nausea, abdominal pain, diarrhea, constipation and blood in stool.  Genitourinary: Negative for dysuria, hematuria, vaginal discharge, difficulty urinating and menstrual problem.  Skin: Negative for rash.  Neurological: Negative for syncope, weakness, light-headedness, numbness and headaches.  Psychiatric/Behavioral: Negative for confusion and dysphoric mood. The patient is not nervous/anxious.        Objective:   Physical Exam  Constitutional:  Vital signs are normal. She appears well-developed and well-nourished. She is cooperative.  Non-toxic appearance. She does not appear ill. No distress.  HENT:  Head: Normocephalic.  Right Ear: Hearing, tympanic membrane, external ear and ear canal normal.  Left Ear: Hearing, tympanic membrane, external ear and ear canal normal.  Nose: Nose normal.  Eyes: Conjunctivae normal, EOM and lids are normal. Pupils are equal, round, and reactive to light. No foreign bodies found.  Neck: Trachea normal and normal range of motion. Neck supple. Carotid bruit is not present. No mass and no thyromegaly present.  Cardiovascular: Normal rate, regular rhythm, S1 normal, S2 normal, normal heart sounds and intact distal pulses.  Exam reveals no gallop.   No murmur heard. Pulmonary/Chest: Effort normal and breath sounds normal. No respiratory distress. She has no wheezes. She has no rhonchi. She has no rales.  Abdominal: Soft. Normal appearance and bowel sounds are normal. She exhibits no distension, no fluid wave, no abdominal bruit and no mass. There is no hepatosplenomegaly. There is no tenderness. There is no rebound, no guarding and no CVA tenderness. No hernia.  Genitourinary: No breast swelling, tenderness, discharge or bleeding. Right adnexum displays no mass, no tenderness and no fullness. Left adnexum displays no mass, no tenderness and no fullness.  Lymphadenopathy:    She has no cervical adenopathy.    She has no axillary adenopathy.  Neurological: She is alert. She has normal strength. No cranial nerve deficit or sensory deficit.  Skin: Skin is warm, dry and intact. No rash noted.  Psychiatric: Her speech is normal and behavior is normal. Judgment normal. Her mood appears not anxious. Cognition and memory are normal. She does not exhibit a depressed mood.    Diabetic  foot exam: Normal inspection No skin breakdown No calluses  Normal DP pulses Normal sensation to light touch and monofilament Nails  normal       Assessment & Plan:  The patient's preventative maintenance and recommended screening tests for an annual wellness exam were reviewed in full today. Brought up to date unless services declined.  Counselled on the importance of diet, exercise, and its role in overall health and mortality. The patient's FH and SH was reviewed, including their home life, tobacco status, and drug and alcohol status.   Vaccines UPtodate with TDap, refused flu, PNA and shingles Nonsmoker.  Colon: Do not have records.. Last may have been done 2006, nml. Next likely 2016.  Mammo:12/2011  PAP/DVE:partial hysterectomy , has left ovary, no cervix. NO PAP, but yearly DVE Osteopenia, improved 09/2010. Stopped fosamax after being on it for 3 years. Repeat in 2 years, 2015.

## 2012-02-23 NOTE — Telephone Encounter (Signed)
Let her know that she came to me in 2009 with an A1C of 6.5 which is technically a diagnosis of diabetes, not prediabtes. Her diabetes is very well controlled and improved some since then but once you have that diagnosis it remains given you predisposition to other health issues associated with diabetes.

## 2012-06-10 ENCOUNTER — Other Ambulatory Visit: Payer: Self-pay | Admitting: Family Medicine

## 2013-01-30 ENCOUNTER — Ambulatory Visit (INDEPENDENT_AMBULATORY_CARE_PROVIDER_SITE_OTHER): Payer: BC Managed Care – PPO | Admitting: Family Medicine

## 2013-01-30 ENCOUNTER — Encounter: Payer: Self-pay | Admitting: Family Medicine

## 2013-01-30 VITALS — BP 126/80 | HR 62 | Temp 98.5°F | Ht 62.5 in | Wt 176.5 lb

## 2013-01-30 DIAGNOSIS — J040 Acute laryngitis: Secondary | ICD-10-CM

## 2013-01-30 DIAGNOSIS — B9789 Other viral agents as the cause of diseases classified elsewhere: Secondary | ICD-10-CM

## 2013-01-30 DIAGNOSIS — B349 Viral infection, unspecified: Secondary | ICD-10-CM

## 2013-01-30 MED ORDER — HYDROCODONE-HOMATROPINE 5-1.5 MG/5ML PO SYRP: ORAL_SOLUTION | ORAL | Status: DC

## 2013-01-30 NOTE — Progress Notes (Signed)
Patient Name: Karla Ortega Date of Birth: Apr 19, 1950 Medical Record Number: [id number]  History of Present Illness:  Patent presents with runny nose, sneezing, cough, sore throat, malaise and minimal / low-grade fever .  Cough and losing voice.  Has had cough 10 days.  She think she may be better somewhat today compared to yesterday. She is having some significant laryngitis.  The patent denies sore throat as the primary complaint. Denies sthortness of breath/wheezing, high fever, chest pain, rhinits for more than 14 days, significant myalgia, otalgia, facial pain, abdominal pain, changes in bowel or bladder.  PMH, PHS, Allergies, Problem List, Medications, Family History, and Social History have all been reviewed.  Patient Active Problem List   Diagnosis Date Noted  . Weakness, presyncope 09/18/2010  . OSTEOPENIA 09/24/2008  . HYPERCHOLESTEROLEMIA 08/28/2008  . DIABETES MELLITUS, TYPE II 11/14/2007  . HYPERTENSION 11/14/2007  . CARDIAC MURMUR, BENIGN 11/14/2007    Past Medical History  Diagnosis Date  . Hypertension     Past Surgical History  Procedure Laterality Date  . Partial hysterectomy      has left ovary.  Tubal pregnancy    History   Social History  . Marital Status: Divorced    Spouse Name: N/A    Number of Children: 0  . Years of Education: N/A   Occupational History  . teaches kindergarten    Social History Main Topics  . Smoking status: Never Smoker   . Smokeless tobacco: Never Used  . Alcohol Use: No  . Drug Use: No  . Sexual Activity: Not on file   Other Topics Concern  . Not on file   Social History Narrative   Married   Regular exercise- yes, walks daily   Diet- fruits and veggies    Family History  Problem Relation Age of Onset  . Hypertension Mother   . Arthritis Mother     osteoarthritis  . Coronary artery disease Father     MI age 4  . Hypertension Father   . Deep vein thrombosis Father   . COPD Neg Hx     No  Known Allergies  Medication list reviewed and updated in full in [facility].  Review of Systems: as above, eating and drinking - tolerating PO. Urinating normally. No excessive vomitting or diarrhea. O/w as above.  Physical Exam:  Filed Vitals:   01/30/13 1602  BP: 126/80  Pulse: 62  Temp: 98.5 F (36.9 C)  TempSrc: Oral  Height: 5' 2.5" (1.588 m)  Weight: 176 lb 8 oz (80.06 kg)  SpO2: 96%    GEN: WDWN, Non-toxic, Atraumatic, normocephalic. A and O x 3. HEENT: Oropharynx clear without exudate, MMM, no significant LAD, mild rhinnorhea Ears: TM clear, COL visualized with good landmarks CV: RRR, no m/g/r. Pulm: CTA B, no wheezes, rhonchi, or crackles, normal respiratory effort. EXT: no c/c/e Psych: well oriented, neither depressed nor anxious in appearance   Laryngitis  Viral syndrome  I reviewed her this is typically virally driven. I gave her some Hycodan, and told her to push fluids and to continue with supportive care. If she gets worse or develops a fever, she will call me.  New medications, updates to list, dose adjustments: Meds ordered this encounter  Medications  . Red Yeast Rice 600 MG CAPS    Sig: Take 2 capsules by mouth daily.  Marland Kitchen CINNAMON PO    Sig: Take 2 tablets by mouth daily.  Marland Kitchen HYDROcodone-homatropine (HYCODAN) 5-1.5 MG/5ML syrup  Sig: 1 tsp po at night before bed prn cough    Dispense:  240 mL    Refill:  0    Signed,  Zohan Shiflet T. Zaynah Chawla, MD, CAQ Sports Medicine  [facility] [address] [place] [address] Phone: [phone] Fax: [phone]  Updated Complete Medication List:   Medication List       This list is accurate as of: 01/30/13 11:59 PM.  Always use your most recent med list.               accu-chek multiclix lancets  Use as instructed     aspirin 81 MG tablet  Take 81 mg by mouth daily.     CALCIUM 600+D 600-400 MG-UNIT per tablet  Generic drug:  Calcium Carbonate-Vitamin D    Take 1 tablet by mouth daily.     CINNAMON PO  Take 2 tablets by mouth daily.     Fish Oil Oil  Take one capsule by mouth once a day     glucose blood test strip  Commonly known as:  ACCU-CHEK SMARTVIEW  Pt checks BS once daily and as needed.250.00.     HYDROcodone-homatropine 5-1.5 MG/5ML syrup  Commonly known as:  HYCODAN  1 tsp po at night before bed prn cough     lisinopril-hydrochlorothiazide 20-12.5 MG per tablet  Commonly known as:  PRINZIDE,ZESTORETIC  TAKE 1 TABLET BY MOUTH DAILY     multivitamin tablet  Take 1 tablet by mouth daily.     ODOR FREE GARLIC 100 MG Tabs  Generic drug:  Garlic  Take one by mouth daily     Red Yeast Rice 600 MG Caps  Take 2 capsules by mouth daily.

## 2013-01-30 NOTE — Progress Notes (Signed)
Pre-visit discussion using our clinic review tool. No additional management support is needed unless otherwise documented below in the visit note.  

## 2013-02-14 ENCOUNTER — Other Ambulatory Visit: Payer: BC Managed Care – PPO

## 2013-02-14 ENCOUNTER — Telehealth: Payer: Self-pay | Admitting: Family Medicine

## 2013-02-14 DIAGNOSIS — M899 Disorder of bone, unspecified: Secondary | ICD-10-CM

## 2013-02-14 DIAGNOSIS — E7800 Pure hypercholesterolemia, unspecified: Secondary | ICD-10-CM

## 2013-02-14 DIAGNOSIS — I10 Essential (primary) hypertension: Secondary | ICD-10-CM

## 2013-02-14 DIAGNOSIS — E119 Type 2 diabetes mellitus without complications: Secondary | ICD-10-CM

## 2013-02-14 NOTE — Telephone Encounter (Signed)
Message copied by Excell Seltzer on Tue Feb 14, 2013  3:36 PM ------      Message from: Alvina Chou      Created: Tue Feb 07, 2013  4:08 PM      Regarding: Lab orders for Tuesday, 12.23.14       Patient is scheduled for CPX labs, please order future labs, Thanks , Karla Ortega       ------

## 2013-02-15 NOTE — Addendum Note (Signed)
Addended by: Baldomero Lamy on: 02/15/2013 09:41 AM   Modules accepted: Orders

## 2013-02-24 ENCOUNTER — Encounter: Payer: BC Managed Care – PPO | Admitting: Family Medicine

## 2013-02-27 LAB — HM DIABETES EYE EXAM

## 2013-03-12 ENCOUNTER — Other Ambulatory Visit: Payer: Self-pay | Admitting: Family Medicine

## 2013-03-17 ENCOUNTER — Other Ambulatory Visit: Payer: Self-pay | Admitting: Family Medicine

## 2013-03-17 MED ORDER — GLUCOSE BLOOD VI STRP: ORAL_STRIP | Status: DC

## 2013-04-05 ENCOUNTER — Encounter: Payer: Self-pay | Admitting: Family Medicine

## 2013-04-14 ENCOUNTER — Telehealth: Payer: Self-pay | Admitting: Family Medicine

## 2013-04-14 NOTE — Telephone Encounter (Signed)
Patient called to schedule her physical.  She said the only time she can come for a physical is after 3:15.  She works and she said she can't take time off of work to come for a physical earlier.  She wanted to know if she could see someone else or if you could fit her in after 3:15.  Please advise.

## 2013-04-14 NOTE — Telephone Encounter (Signed)
You can put her in a 30 min 3:15 slot

## 2013-04-27 ENCOUNTER — Encounter: Payer: Self-pay | Admitting: Family Medicine

## 2013-04-27 ENCOUNTER — Ambulatory Visit (INDEPENDENT_AMBULATORY_CARE_PROVIDER_SITE_OTHER): Payer: BC Managed Care – PPO | Admitting: Family Medicine

## 2013-04-27 VITALS — BP 110/70 | HR 72 | Temp 98.2°F | Ht 62.0 in | Wt 177.0 lb

## 2013-04-27 DIAGNOSIS — M8580 Other specified disorders of bone density and structure, unspecified site: Secondary | ICD-10-CM

## 2013-04-27 DIAGNOSIS — Z0000 Encounter for general adult medical examination without abnormal findings: Secondary | ICD-10-CM

## 2013-04-27 DIAGNOSIS — E119 Type 2 diabetes mellitus without complications: Secondary | ICD-10-CM

## 2013-04-27 DIAGNOSIS — M949 Disorder of cartilage, unspecified: Secondary | ICD-10-CM

## 2013-04-27 DIAGNOSIS — E7800 Pure hypercholesterolemia, unspecified: Secondary | ICD-10-CM

## 2013-04-27 DIAGNOSIS — I10 Essential (primary) hypertension: Secondary | ICD-10-CM

## 2013-04-27 DIAGNOSIS — M899 Disorder of bone, unspecified: Secondary | ICD-10-CM

## 2013-04-27 LAB — HM DIABETES FOOT EXAM

## 2013-04-27 NOTE — Assessment & Plan Note (Signed)
Due for re-eval. 

## 2013-04-27 NOTE — Progress Notes (Signed)
Pre visit review using our clinic review tool, if applicable. No additional management support is needed unless otherwise documented below in the visit note. 

## 2013-04-27 NOTE — Progress Notes (Signed)
The patient is here for annual wellness exam and preventative care.   Hypertension: Well controlled on lisinopril HCTZ.  BP Readings from Last 3 Encounters:  04/27/13 110/70  01/30/13 126/80  02/23/12 116/70  Using medication without problems or lightheadedness: None  Chest pain with exertion:None  Edema:None  Short of breath:None  Average home BPs: 115/70  Other issues:   Diabetes: Due for re-eval. On no med.  Lab Results  Component Value Date   HGBA1C 6.8* 02/23/2012  Using medications without difficulties:  Hypoglycemic episodes:None  Hyperglycemic episodes:None  Feet problems: None  Blood Sugars averaging: FBS 85-102, 2 hour post prandial 100  eye exam within last year:Yes   Elevated Cholesterol: Has stopped red yeast rice, no problem just ran out. Due for labs re-eval.  Lab Results   Component  Value  Date    CHOL  211*  08/06/2010    HDL  80.00  08/06/2010    LDLCALC  105*  12/20/2009    LDLDIRECT  107.6  08/06/2010    TRIG  70.0  08/06/2010    CHOLHDL  3  08/06/2010   Diet compliance: Good, sticking to DM diet  Exercise:walking occ  Other complaints:   Review of Systems  Constitutional: Negative for fever, fatigue and unexpected weight change.  HENT: Negative for ear pain, congestion, sore throat, sneezing, trouble swallowing and sinus pressure.  Eyes: Negative for pain and itching.  Respiratory: Negative for cough, shortness of breath and wheezing.  Cardiovascular: Negative for chest pain, palpitations and leg swelling.  Gastrointestinal: Negative for nausea, abdominal pain, diarrhea, constipation and blood in stool.  Genitourinary: Negative for dysuria, hematuria, vaginal discharge, difficulty urinating and menstrual problem.  Skin: Negative for rash.  Neurological: Negative for syncope, weakness, light-headedness, numbness and headaches.  Psychiatric/Behavioral: Negative for confusion and dysphoric mood. The patient is not nervous/anxious.  Objective:   Physical  Exam  Constitutional: Vital signs are normal. She appears well-developed and well-nourished. She is cooperative. Non-toxic appearance. She does not appear ill. No distress.  HENT:  Head: Normocephalic.  Right Ear: Hearing, tympanic membrane, external ear and ear canal normal.  Left Ear: Hearing, tympanic membrane, external ear and ear canal normal.  Nose: Nose normal.  Eyes: Conjunctivae normal, EOM and lids are normal. Pupils are equal, round, and reactive to light. No foreign bodies found.  Neck: Trachea normal and normal range of motion. Neck supple. Carotid bruit is not present. No mass and no thyromegaly present.  Cardiovascular: Normal rate, regular rhythm, S1 normal, S2 normal, normal heart sounds and intact distal pulses. Exam reveals no gallop.  No murmur heard.  Pulmonary/Chest: Effort normal and breath sounds normal. No respiratory distress. She has no wheezes. She has no rhonchi. She has no rales.  Abdominal: Soft. Normal appearance and bowel sounds are normal. She exhibits no distension, no fluid wave, no abdominal bruit and no mass. There is no hepatosplenomegaly. There is no tenderness. There is no rebound, no guarding and no CVA tenderness. No hernia.  Genitourinary: No breast swelling, tenderness, discharge or bleeding. Right adnexum displays no mass, no tenderness and no fullness. Left adnexum displays no mass, no tenderness and no fullness.  Lymphadenopathy:  She has no cervical adenopathy.  She has no axillary adenopathy.  Neurological: She is alert. She has normal strength. No cranial nerve deficit or sensory deficit.  Skin: Skin is warm, dry and intact. No rash noted.  Psychiatric: Her speech is normal and behavior is normal. Judgment normal. Her mood appears not  anxious. Cognition and memory are normal. She does not exhibit a depressed mood.   Diabetic foot exam:  Normal inspection  No skin breakdown  No calluses  Normal DP pulses  Normal sensation to light touch and  monofilament  Nails normal  Assessment & Plan:   The patient's preventative maintenance and recommended screening tests for an annual wellness exam were reviewed in full today.  Brought up to date unless services declined.  Counselled on the importance of diet, exercise, and its role in overall health and mortality.  The patient's FH and SH was reviewed, including their home life, tobacco status, and drug and alcohol status.   Vaccines UPtodate with TDap, refused flu, PNA and shingles  Nonsmoker.  Colon: Do not have records.. Last may have been done 2006, nml. Next likely 2016.  Mammo:03/2013 PAP/DVE: partial hysterectomy , has left ovary, no cervix. NO PAP, but yearly DVE  Osteopenia, improved 09/2010. Stopped fosamax after being on it for 3 years. Repeat in 2 years, 2015.

## 2013-04-27 NOTE — Patient Instructions (Signed)
Return for a lab only appt fasting in next few weeks.  Stop at the front desk to set up bone density.

## 2013-04-27 NOTE — Assessment & Plan Note (Signed)
Well controlled. Continue current medication.  

## 2013-04-28 ENCOUNTER — Telehealth: Payer: Self-pay | Admitting: Family Medicine

## 2013-04-28 NOTE — Telephone Encounter (Signed)
emmi report mailed to patient ° °

## 2013-05-22 ENCOUNTER — Other Ambulatory Visit: Payer: BC Managed Care – PPO

## 2013-06-06 ENCOUNTER — Encounter: Payer: Self-pay | Admitting: Family Medicine

## 2013-06-06 ENCOUNTER — Other Ambulatory Visit: Payer: Self-pay | Admitting: Family Medicine

## 2013-07-15 ENCOUNTER — Emergency Department (HOSPITAL_COMMUNITY): Payer: BC Managed Care – PPO

## 2013-07-15 ENCOUNTER — Emergency Department (HOSPITAL_COMMUNITY)
Admission: EM | Admit: 2013-07-15 | Discharge: 2013-07-15 | Disposition: A | Discharge status: Home / Self Care | Payer: BC Managed Care – PPO | Attending: Emergency Medicine | Admitting: Emergency Medicine

## 2013-07-15 ENCOUNTER — Encounter (HOSPITAL_COMMUNITY): Payer: Self-pay | Admitting: Emergency Medicine

## 2013-07-15 DIAGNOSIS — S82009A Unspecified fracture of unspecified patella, initial encounter for closed fracture: Secondary | ICD-10-CM

## 2013-07-15 DIAGNOSIS — I712 Thoracic aortic aneurysm, without rupture, unspecified: Secondary | ICD-10-CM | POA: Insufficient documentation

## 2013-07-15 DIAGNOSIS — S2220XA Unspecified fracture of sternum, initial encounter for closed fracture: Secondary | ICD-10-CM

## 2013-07-15 DIAGNOSIS — Z79899 Other long term (current) drug therapy: Secondary | ICD-10-CM | POA: Insufficient documentation

## 2013-07-15 DIAGNOSIS — Z7982 Long term (current) use of aspirin: Secondary | ICD-10-CM | POA: Insufficient documentation

## 2013-07-15 DIAGNOSIS — Y9389 Activity, other specified: Secondary | ICD-10-CM | POA: Insufficient documentation

## 2013-07-15 DIAGNOSIS — I10 Essential (primary) hypertension: Secondary | ICD-10-CM | POA: Insufficient documentation

## 2013-07-15 DIAGNOSIS — I7121 Aneurysm of the ascending aorta, without rupture: Secondary | ICD-10-CM

## 2013-07-15 DIAGNOSIS — Y92410 Unspecified street and highway as the place of occurrence of the external cause: Secondary | ICD-10-CM | POA: Insufficient documentation

## 2013-07-15 LAB — I-STAT CHEM 8, ED
BUN: 13 mg/dL (ref 6–23)
CHLORIDE: 104 meq/L (ref 96–112)
CREATININE: 0.8 mg/dL (ref 0.50–1.10)
Calcium, Ion: 1.21 mmol/L (ref 1.13–1.30)
Glucose, Bld: 106 mg/dL — ABNORMAL HIGH (ref 70–99)
HCT: 38 % (ref 36.0–46.0)
HEMOGLOBIN: 12.9 g/dL (ref 12.0–15.0)
POTASSIUM: 3.6 meq/L — AB (ref 3.7–5.3)
Sodium: 140 mEq/L (ref 137–147)
TCO2: 27 mmol/L (ref 0–100)
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
## 2013-07-15 MED ORDER — OXYCODONE-ACETAMINOPHEN 5-325 MG PO TABS: 1.0000 | ORAL_TABLET | Freq: Four times a day (QID) | ORAL | Status: DC | PRN

## 2013-07-15 MED ORDER — OXYCODONE HCL 5 MG PO TABS
5.0000 mg | ORAL_TABLET | Freq: Once | ORAL | Status: AC
  Administered 2013-07-15: 5 mg via ORAL
  Filled 2013-07-15: qty 1

## 2013-07-15 MED ORDER — IOHEXOL 300 MG/ML  SOLN
80.0000 mL | Freq: Once | INTRAMUSCULAR | Status: AC | PRN
  Administered 2013-07-15: 80 mL via INTRAVENOUS

## 2013-07-15 MED ORDER — HYDROCODONE-ACETAMINOPHEN 5-325 MG PO TABS
2.0000 | ORAL_TABLET | Freq: Once | ORAL | Status: AC
  Administered 2013-07-15: 2 via ORAL
  Filled 2013-07-15: qty 2
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
## 2013-07-15 NOTE — Progress Notes (Signed)
Orthopedic Tech Progress Note Patient Details:  Karla Ortega August 03, 1950 [id number]  Ortho Devices Type of Ortho Device: Crutches;Knee Immobilizer Ortho Device/Splint Location: rle Ortho Device/Splint Interventions: Application   Karla Ortega 07/15/2013, 9:05 PM

## 2013-07-15 NOTE — ED Notes (Signed)
Patient transported to X-ray 
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
## 2013-07-15 NOTE — ED Notes (Signed)
Patient transported to CT 
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
## 2013-07-15 NOTE — ED Notes (Signed)
Patient is in xray.  Family is in the room.

## 2013-07-18 ENCOUNTER — Telehealth (HOSPITAL_BASED_OUTPATIENT_CLINIC_OR_DEPARTMENT_OTHER): Payer: Self-pay

## 2013-07-24 ENCOUNTER — Ambulatory Visit: Payer: BC Managed Care – PPO | Admitting: Thoracic Surgery (Cardiothoracic Vascular Surgery)

## 2013-07-24 ENCOUNTER — Telehealth: Payer: Self-pay | Admitting: Thoracic Surgery (Cardiothoracic Vascular Surgery)

## 2013-07-24 NOTE — Telephone Encounter (Signed)
Called patient to reschedule appointment for late this afternoon or sometime later this week.  Spoke with patient's husband who was instructed to call the office after 9 am to reschedule.  Purcell Nails 07/24/2013 7:57 AM

## 2013-08-10 ENCOUNTER — Ambulatory Visit: Payer: BC Managed Care – PPO | Admitting: Family Medicine

## 2013-08-17 ENCOUNTER — Other Ambulatory Visit: Payer: Self-pay | Admitting: Thoracic Surgery (Cardiothoracic Vascular Surgery)

## 2013-08-17 DIAGNOSIS — I10 Essential (primary) hypertension: Secondary | ICD-10-CM

## 2013-08-18 ENCOUNTER — Ambulatory Visit: Payer: BC Managed Care – PPO | Admitting: Family Medicine

## 2013-08-21 ENCOUNTER — Other Ambulatory Visit: Payer: Self-pay | Admitting: *Deleted

## 2013-08-21 ENCOUNTER — Ambulatory Visit
Admission: RE | Admit: 2013-08-21 | Discharge: 2013-08-21 | Disposition: A | Discharge status: Home / Self Care | Payer: BC Managed Care – PPO | Source: Ambulatory Visit | Attending: Thoracic Surgery (Cardiothoracic Vascular Surgery) | Admitting: Thoracic Surgery (Cardiothoracic Vascular Surgery)

## 2013-08-21 ENCOUNTER — Encounter: Payer: Self-pay | Admitting: Thoracic Surgery (Cardiothoracic Vascular Surgery)

## 2013-08-21 ENCOUNTER — Ambulatory Visit (INDEPENDENT_AMBULATORY_CARE_PROVIDER_SITE_OTHER): Payer: BC Managed Care – PPO | Admitting: Thoracic Surgery (Cardiothoracic Vascular Surgery)

## 2013-08-21 VITALS — BP 112/73 | HR 67 | Resp 20 | Ht 62.0 in | Wt 172.0 lb

## 2013-08-21 DIAGNOSIS — R011 Cardiac murmur, unspecified: Secondary | ICD-10-CM

## 2013-08-21 DIAGNOSIS — I712 Thoracic aortic aneurysm, without rupture, unspecified: Secondary | ICD-10-CM

## 2013-08-21 DIAGNOSIS — I10 Essential (primary) hypertension: Secondary | ICD-10-CM

## 2013-08-21 DIAGNOSIS — I7121 Aneurysm of the ascending aorta, without rupture: Secondary | ICD-10-CM | POA: Insufficient documentation

## 2013-08-21 NOTE — Patient Instructions (Signed)
Make an effort to keep track of your blood pressure and report all significant changes to your primary care physician for adjustment of blood pressure medications as necessary.
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
## 2013-08-28 ENCOUNTER — Telehealth: Payer: Self-pay | Admitting: Thoracic Surgery (Cardiothoracic Vascular Surgery)

## 2013-08-28 ENCOUNTER — Other Ambulatory Visit (HOSPITAL_COMMUNITY): Payer: BC Managed Care – PPO

## 2013-08-28 ENCOUNTER — Ambulatory Visit (HOSPITAL_COMMUNITY)
Admission: RE | Admit: 2013-08-28 | Discharge: 2013-08-28 | Disposition: A | Discharge status: Home / Self Care | Payer: BC Managed Care – PPO | Source: Ambulatory Visit | Attending: Thoracic Surgery (Cardiothoracic Vascular Surgery) | Admitting: Thoracic Surgery (Cardiothoracic Vascular Surgery)

## 2013-08-28 DIAGNOSIS — I059 Rheumatic mitral valve disease, unspecified: Secondary | ICD-10-CM | POA: Insufficient documentation

## 2013-08-28 DIAGNOSIS — I10 Essential (primary) hypertension: Secondary | ICD-10-CM | POA: Insufficient documentation

## 2013-08-28 DIAGNOSIS — I712 Thoracic aortic aneurysm, without rupture, unspecified: Secondary | ICD-10-CM

## 2013-08-28 DIAGNOSIS — E785 Hyperlipidemia, unspecified: Secondary | ICD-10-CM | POA: Insufficient documentation

## 2013-08-28 DIAGNOSIS — E7800 Pure hypercholesterolemia, unspecified: Secondary | ICD-10-CM

## 2013-08-28 DIAGNOSIS — I079 Rheumatic tricuspid valve disease, unspecified: Secondary | ICD-10-CM | POA: Insufficient documentation

## 2013-08-28 DIAGNOSIS — R011 Cardiac murmur, unspecified: Secondary | ICD-10-CM | POA: Insufficient documentation

## 2013-08-28 DIAGNOSIS — I7121 Aneurysm of the ascending aorta, without rupture: Secondary | ICD-10-CM

## 2013-08-28 DIAGNOSIS — E119 Type 2 diabetes mellitus without complications: Secondary | ICD-10-CM | POA: Insufficient documentation

## 2013-08-28 NOTE — Progress Notes (Signed)
Echocardiogram 2D Echocardiogram has been performed.  Karla BasemanReel, Karla Ortega M 08/28/2013, 10:27 AM

## 2013-08-28 NOTE — Telephone Encounter (Signed)
Called to give results of ECHO to patient.  All questions answered.  Karla Ortega H 08/28/2013 1:23 PM

## 2013-09-04 ENCOUNTER — Ambulatory Visit: Payer: BC Managed Care – PPO | Admitting: Family Medicine

## 2013-09-26 ENCOUNTER — Ambulatory Visit: Payer: BC Managed Care – PPO | Admitting: Family Medicine

## 2013-10-03 ENCOUNTER — Other Ambulatory Visit (INDEPENDENT_AMBULATORY_CARE_PROVIDER_SITE_OTHER): Payer: BC Managed Care – PPO

## 2013-10-03 DIAGNOSIS — E119 Type 2 diabetes mellitus without complications: Secondary | ICD-10-CM

## 2013-10-03 DIAGNOSIS — E7800 Pure hypercholesterolemia, unspecified: Secondary | ICD-10-CM

## 2013-10-03 DIAGNOSIS — I10 Essential (primary) hypertension: Secondary | ICD-10-CM

## 2013-10-03 DIAGNOSIS — M899 Disorder of bone, unspecified: Secondary | ICD-10-CM

## 2013-10-03 DIAGNOSIS — M949 Disorder of cartilage, unspecified: Secondary | ICD-10-CM

## 2013-10-03 LAB — LIPID PANEL
CHOLESTEROL: 196 mg/dL (ref 0–200)
HDL: 83.6 mg/dL (ref >39.00)
LDL Cholesterol: 88 mg/dL (ref 0–99)
NonHDL: 112.4
TRIGLYCERIDES: 120 mg/dL (ref 0.0–149.0)
Total CHOL/HDL Ratio: 2
VLDL: 24 mg/dL (ref 0.0–40.0)

## 2013-10-03 LAB — COMPREHENSIVE METABOLIC PANEL
ALT: 16 U/L (ref 0–35)
AST: 15 U/L (ref 0–37)
Albumin: 4.2 g/dL (ref 3.5–5.2)
Alkaline Phosphatase: 78 U/L (ref 39–117)
BUN: 11 mg/dL (ref 6–23)
CHLORIDE: 104 meq/L (ref 96–112)
CO2: 24 mEq/L (ref 19–32)
Calcium: 9.5 mg/dL (ref 8.4–10.5)
Creatinine, Ser: 0.7 mg/dL (ref 0.4–1.2)
GFR: 102.17 mL/min (ref >60.00)
Glucose, Bld: 88 mg/dL (ref 70–99)
POTASSIUM: 3.8 meq/L (ref 3.5–5.1)
Sodium: 139 mEq/L (ref 135–145)
TOTAL PROTEIN: 7.7 g/dL (ref 6.0–8.3)
Total Bilirubin: 0.3 mg/dL (ref 0.2–1.2)

## 2013-10-03 LAB — VITAMIN D 25 HYDROXY (VIT D DEFICIENCY, FRACTURES): VITD: 40.23 ng/mL (ref 30.00–100.00)

## 2013-10-03 LAB — HEMOGLOBIN A1C: Hgb A1c MFr Bld: 6.4 % (ref 4.6–6.5)

## 2013-10-03 NOTE — Addendum Note (Signed)
Addended by: Alvina ChouWALSH, Krisann Mckenna J on: 10/03/2013 11:26 AM   Modules accepted: Orders

## 2013-10-06 ENCOUNTER — Encounter: Payer: Self-pay | Admitting: Family Medicine

## 2013-10-06 ENCOUNTER — Ambulatory Visit (INDEPENDENT_AMBULATORY_CARE_PROVIDER_SITE_OTHER): Payer: BC Managed Care – PPO | Admitting: Family Medicine

## 2013-10-06 VITALS — BP 142/78 | HR 56 | Temp 98.3°F | Wt 177.0 lb

## 2013-10-06 DIAGNOSIS — I712 Thoracic aortic aneurysm, without rupture, unspecified: Secondary | ICD-10-CM

## 2013-10-06 DIAGNOSIS — E7800 Pure hypercholesterolemia, unspecified: Secondary | ICD-10-CM

## 2013-10-06 DIAGNOSIS — E119 Type 2 diabetes mellitus without complications: Secondary | ICD-10-CM

## 2013-10-06 DIAGNOSIS — I10 Essential (primary) hypertension: Secondary | ICD-10-CM

## 2013-10-06 DIAGNOSIS — I7121 Aneurysm of the ascending aorta, without rupture: Secondary | ICD-10-CM

## 2013-10-06 LAB — HM DIABETES FOOT EXAM

## 2013-10-06 NOTE — Patient Instructions (Signed)
Follow BP at home. Call if consistently > 140/90. Schedule CPX with labs prior in 04/2013. Work on exercise, healthy eating and weight loss.
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
## 2013-10-06 NOTE — Assessment & Plan Note (Signed)
Well controlled on no med. 

## 2013-10-06 NOTE — Assessment & Plan Note (Signed)
At goal on red yeast rice. 

## 2013-10-06 NOTE — Assessment & Plan Note (Signed)
Borderline control. Previously well controlled on lisinopril HCTZ. She will follow at home and call if consistently > 140/90. Encouraged exercise, weight loss, healthy eating habits.

## 2013-10-06 NOTE — Progress Notes (Signed)
Pre visit review using our clinic review tool, if applicable. No additional management support is needed unless otherwise documented below in the visit note. 

## 2013-10-12 DIAGNOSIS — Z0279 Encounter for issue of other medical certificate: Secondary | ICD-10-CM

## 2013-10-31 ENCOUNTER — Other Ambulatory Visit: Payer: BC Managed Care – PPO

## 2013-11-03 ENCOUNTER — Ambulatory Visit: Payer: BC Managed Care – PPO | Admitting: Family Medicine

## 2013-12-06 ENCOUNTER — Other Ambulatory Visit: Payer: Self-pay | Admitting: Family Medicine

## 2013-12-08 ENCOUNTER — Other Ambulatory Visit: Payer: Self-pay

## 2014-06-06 ENCOUNTER — Telehealth: Payer: Self-pay | Admitting: *Deleted

## 2014-06-06 MED ORDER — LISINOPRIL-HYDROCHLOROTHIAZIDE 20-12.5 MG PO TABS
1.0000 | ORAL_TABLET | Freq: Every day | ORAL | Status: DC
Start: 2014-06-06 — End: 2014-08-21

## 2014-06-06 NOTE — Telephone Encounter (Signed)
Please call and schedule CPE with fasting labs with Dr. Bedsole. 

## 2014-06-24 LAB — HM DIABETES EYE EXAM

## 2014-07-07 ENCOUNTER — Other Ambulatory Visit: Payer: Self-pay | Admitting: Family Medicine

## 2014-07-07 NOTE — Telephone Encounter (Signed)
Please schedule CPE with fasting labs prior with Dr. Bedsole.  

## 2014-07-26 ENCOUNTER — Other Ambulatory Visit: Payer: Self-pay | Admitting: *Deleted

## 2014-07-26 DIAGNOSIS — I712 Thoracic aortic aneurysm, without rupture, unspecified: Secondary | ICD-10-CM

## 2014-08-10 ENCOUNTER — Telehealth: Payer: Self-pay | Admitting: Family Medicine

## 2014-08-10 NOTE — Telephone Encounter (Signed)
Okay to work in sooner in 30 min slot.

## 2014-08-10 NOTE — Telephone Encounter (Signed)
Patient called to schedule an appointment for a cpx.  Patient was told first available is in September.  Patient said she can't wait until September.  She wants to know if Dr.Bedsole can see her in June for the physical, or can she see another provider.

## 2014-08-15 LAB — HM MAMMOGRAPHY: HM MAMMO: NORMAL

## 2014-08-16 ENCOUNTER — Encounter: Payer: Self-pay | Admitting: Family Medicine

## 2014-08-20 ENCOUNTER — Other Ambulatory Visit: Payer: Self-pay

## 2014-08-21 ENCOUNTER — Other Ambulatory Visit: Payer: Self-pay | Admitting: Family Medicine

## 2014-08-24 ENCOUNTER — Ambulatory Visit (INDEPENDENT_AMBULATORY_CARE_PROVIDER_SITE_OTHER): Payer: BC Managed Care – PPO | Admitting: Family Medicine

## 2014-08-24 ENCOUNTER — Encounter: Payer: Self-pay | Admitting: Family Medicine

## 2014-08-24 ENCOUNTER — Other Ambulatory Visit: Payer: Self-pay | Admitting: Family Medicine

## 2014-08-24 VITALS — BP 128/61 | HR 55 | Temp 98.6°F | Ht 62.25 in | Wt 179.5 lb

## 2014-08-24 DIAGNOSIS — E119 Type 2 diabetes mellitus without complications: Secondary | ICD-10-CM

## 2014-08-24 DIAGNOSIS — Z0000 Encounter for general adult medical examination without abnormal findings: Secondary | ICD-10-CM | POA: Diagnosis not present

## 2014-08-24 DIAGNOSIS — E7800 Pure hypercholesterolemia, unspecified: Secondary | ICD-10-CM

## 2014-08-24 DIAGNOSIS — I10 Essential (primary) hypertension: Secondary | ICD-10-CM

## 2014-08-24 LAB — HM DIABETES FOOT EXAM

## 2014-08-24 NOTE — Assessment & Plan Note (Signed)
Due for re-eval. 

## 2014-08-24 NOTE — Patient Instructions (Addendum)
Schedule fasting labs in next week to check DM and cholesterol. Increase exercise to 3-5 times , keep working on healthy eating.

## 2014-08-24 NOTE — Assessment & Plan Note (Signed)
Due for re-eval. Well contorolled at home..Marland Kitchen

## 2014-08-24 NOTE — Progress Notes (Signed)
The patient is here for annual wellness exam and preventative care.   Hypertension: Well controlled on lisinopril HCTZ.  BP Readings from Last 3 Encounters:  08/24/14 128/61  10/06/13 142/78  08/21/13 112/73  Using medication without problems or lightheadedness: None  Chest pain with exertion:None  Edema:None  Short of breath:None  Average home BPs: 115/60s Other issues:   Diabetes: Due for re-eval. On no med.  Lab Results  Component Value Date   HGBA1C 6.4 10/03/2013  Using medications without difficulties:  Hypoglycemic episodes:None  Hyperglycemic episodes:None  Feet problems: None  Blood Sugars averaging: FBS 89-101 2 hour post prandial 101 eye exam within last year: yes  Elevated Cholesterol:  Due for labs re-eval.  Lab Results   Component  Value  Date    CHOL  211*  08/06/2010    HDL  80.00  08/06/2010    LDLCALC  105*  12/20/2009    LDLDIRECT  107.6  08/06/2010    TRIG  70.0  08/06/2010    CHOLHDL  3  08/06/2010   Diet compliance: Good, sticking to DM diet  Exercise:walking occ  Other complaints:   She is having difficult losing weight. She has tried slimfast,  Boneta LucksJenny craig,healthy  eating. Wt Readings from Last 3 Encounters:  08/24/14 179 lb 8 oz (81.421 kg)  10/06/13 177 lb (80.287 kg)  08/21/13 172 lb (78.019 kg)     Review of Systems  Constitutional: Negative for fever, fatigue and unexpected weight change.  HENT: Negative for ear pain, congestion, sore throat, sneezing, trouble swallowing and sinus pressure.  Eyes: Negative for pain and itching.  Respiratory: Negative for cough, shortness of breath and wheezing.  Cardiovascular: Negative for chest pain, palpitations and leg swelling.  Gastrointestinal: Negative for nausea, abdominal pain, diarrhea, constipation and blood in stool. Genitourinary: Negative for dysuria, hematuria, vaginal discharge, difficulty urinating and menstrual problem.  Skin: Negative  for rash.  Neurological: Negative for syncope, weakness, light-headedness, numbness and headaches.  Psychiatric/Behavioral: Negative for confusion and dysphoric mood. The patient is not nervous/anxious.  Objective:   Physical Exam  Constitutional: Vital signs are normal. She appears well-developed and well-nourished. She is cooperative. Non-toxic appearance. She does not appear ill. No distress.  HENT:  Head: Normocephalic.  Right Ear: Hearing, tympanic membrane, external ear and ear canal normal.  Left Ear: Hearing, tympanic membrane, external ear and ear canal normal.  Nose: Nose normal.  Eyes: Conjunctivae normal, EOM and lids are normal. Pupils are equal, round, and reactive to light. No foreign bodies found.  Neck: Trachea normal and normal range of motion. Neck supple. Carotid bruit is not present. No mass and no thyromegaly present.  Cardiovascular: Normal rate, regular rhythm, S1 normal, S2 normal, normal heart sounds and intact distal pulses. Exam reveals no gallop.  No murmur heard.  Pulmonary/Chest: Effort normal and breath sounds normal. No respiratory distress. She has no wheezes. She has no rhonchi. She has no rales.  Abdominal: Soft. Normal appearance and bowel sounds are normal. She exhibits no distension, no fluid wave, no abdominal bruit and no mass. There is no hepatosplenomegaly. There is no tenderness. There is no rebound, no guarding and no CVA tenderness. No hernia.  Genitourinary: No breast swelling, tenderness, discharge or bleeding. Right adnexum displays no mass, no tenderness and no fullness. Left adnexum displays no mass, no tenderness and no fullness.  Lymphadenopathy:  She has no cervical adenopathy.  She has no axillary adenopathy.  Neurological: She is alert. She has normal strength. No  cranial nerve deficit or sensory deficit.  Skin: Skin is warm, dry and intact. No rash noted.  Psychiatric: Her speech is normal and behavior is normal. Judgment  normal. Her mood appears not anxious. Cognition and memory are normal. She does not exhibit a depressed mood.   Diabetic foot exam:  Normal inspection  No skin breakdown  No calluses  Normal DP pulses  Normal sensation to light touch and monofilament  Nails normal  Assessment & Plan:   The patient's preventative maintenance and recommended screening tests for an annual wellness exam were reviewed in full today.  Brought up to date unless services declined.  Counselled on the importance of diet, exercise, and its role in overall health and mortality.  The patient's FH and SH was reviewed, including their home life, tobacco status, and drug and alcohol status.   Vaccines Uptodate with TDap, refused flu, PNA and shingles  Nonsmoker.  Colon: Scheduled for repeat next week, Dr.Hans.  Mammo:nml 07/2014 PAP/DVE: partial hysterectomy , has left ovary, no cervix. NO PAP, but yearly DVE   DEXA: Osteopenia, improved 09/2010. Stopped fosamax after being on it for 3 years. Last check 2015.Marland Kitchen Showed stable osteopenia.  NOW CHECK every 5 years.

## 2014-08-24 NOTE — Assessment & Plan Note (Signed)
Well controlled. Continue current medication. Encouraged exercise, weight loss, healthy eating habits.  

## 2014-08-24 NOTE — Progress Notes (Signed)
Pre visit review using our clinic review tool, if applicable. No additional management support is needed unless otherwise documented below in the visit note. 

## 2014-08-30 ENCOUNTER — Other Ambulatory Visit (INDEPENDENT_AMBULATORY_CARE_PROVIDER_SITE_OTHER): Payer: BC Managed Care – PPO

## 2014-08-30 ENCOUNTER — Telehealth: Payer: Self-pay | Admitting: Family Medicine

## 2014-08-30 DIAGNOSIS — E7800 Pure hypercholesterolemia, unspecified: Secondary | ICD-10-CM

## 2014-08-30 DIAGNOSIS — E119 Type 2 diabetes mellitus without complications: Secondary | ICD-10-CM | POA: Diagnosis not present

## 2014-08-30 DIAGNOSIS — M8580 Other specified disorders of bone density and structure, unspecified site: Secondary | ICD-10-CM

## 2014-08-30 LAB — COMPREHENSIVE METABOLIC PANEL
ALK PHOS: 73 U/L (ref 39–117)
ALT: 18 U/L (ref 0–35)
AST: 15 U/L (ref 0–37)
Albumin: 3.9 g/dL (ref 3.5–5.2)
BILIRUBIN TOTAL: 0.3 mg/dL (ref 0.2–1.2)
BUN: 14 mg/dL (ref 6–23)
CO2: 26 meq/L (ref 19–32)
CREATININE: 0.77 mg/dL (ref 0.40–1.20)
Calcium: 9.5 mg/dL (ref 8.4–10.5)
Chloride: 105 mEq/L (ref 96–112)
GFR: 97.31 mL/min (ref >60.00)
Glucose, Bld: 94 mg/dL (ref 70–99)
POTASSIUM: 3.6 meq/L (ref 3.5–5.1)
SODIUM: 141 meq/L (ref 135–145)
Total Protein: 7.2 g/dL (ref 6.0–8.3)

## 2014-08-30 LAB — LIPID PANEL
Cholesterol: 191 mg/dL (ref 0–200)
HDL: 66.2 mg/dL (ref >39.00)
LDL CALC: 102 mg/dL — AB (ref 0–99)
NonHDL: 124.8
Total CHOL/HDL Ratio: 3
Triglycerides: 115 mg/dL (ref 0.0–149.0)
VLDL: 23 mg/dL (ref 0.0–40.0)

## 2014-08-30 LAB — HEMOGLOBIN A1C: Hgb A1c MFr Bld: 6.4 % (ref 4.6–6.5)

## 2014-08-30 LAB — VITAMIN D 25 HYDROXY (VIT D DEFICIENCY, FRACTURES): VITD: 24.16 ng/mL — ABNORMAL LOW (ref 30.00–100.00)

## 2014-08-30 NOTE — Telephone Encounter (Signed)
-----   Message from Alvina Chouerri J Walsh sent at 08/28/2014  4:05 PM EDT ----- Regarding: Lab orders for Thursday, 7.7.16 Lab order, no f/u appt

## 2014-08-31 MED ORDER — VITAMIN D (ERGOCALCIFEROL) 1.25 MG (50000 UNIT) PO CAPS: 50000.0000 [IU] | ORAL_CAPSULE | ORAL | Status: DC

## 2014-08-31 NOTE — Addendum Note (Signed)
Addended by: Damita LackLORING, Jermall Isaacson S on: 08/31/2014 09:40 AM   Modules accepted: Orders

## 2014-09-03 ENCOUNTER — Encounter: Payer: BC Managed Care – PPO | Admitting: Thoracic Surgery (Cardiothoracic Vascular Surgery)

## 2014-09-03 ENCOUNTER — Other Ambulatory Visit: Payer: BC Managed Care – PPO

## 2014-09-12 ENCOUNTER — Telehealth: Payer: Self-pay | Admitting: Family Medicine

## 2014-09-12 NOTE — Telephone Encounter (Signed)
[facility] Client TELEPHONE ADVICE RECORD [facility] Patient Name: Karla Ortega DOB: [birth year] Initial Comment Caller states c/o diarrhea and chills Nurse Assessment Nurse: Ladona RidgelGaddy, RN, Felicia Date/Time (Eastern Time): 09/12/2014 3:03:19 PM Confirm and document reason for call. If symptomatic, describe symptoms. ---Pt has diarrhea onset 2 nights ago. No fever. Not as bad today bc she is using imodium. Nausea. Has the patient traveled out of the country within the last 30 days? ---Yes Where have you traveled? ([place] for [place] and Ebola guideline, [place], [place] for MERS) ---[place] Does the patient require triage? ---Yes Related visit to physician within the last 2 weeks? ---Yes Does the PT have any chronic conditions? (i.e. diabetes, asthma, etc.) ---No Guidelines Guideline Title Affirmed Question Affirmed Notes Diarrhea Travel to a foreign country in past month Final Disposition User Call PCP within 24 Hours Gaddy, RN, FeliciaComments Only 1 stool today without pain and no fever so she declined input from MD right now. (outcome was call MD in 24 h). Let MD know so she can decide if she needs to contact caller. Disagree/Comply: Disagree Disagree/Comply Reason: Disagree with instructions Call Id: [phone]

## 2014-09-12 NOTE — Telephone Encounter (Signed)
Noted. Symptomatic care and call if not improving.

## 2014-09-18 ENCOUNTER — Encounter: Payer: Self-pay | Admitting: Family Medicine

## 2014-09-18 ENCOUNTER — Ambulatory Visit (INDEPENDENT_AMBULATORY_CARE_PROVIDER_SITE_OTHER): Payer: BC Managed Care – PPO | Admitting: Family Medicine

## 2014-09-18 VITALS — BP 110/60 | HR 77 | Temp 98.8°F | Ht 62.25 in | Wt 177.0 lb

## 2014-09-18 DIAGNOSIS — R197 Diarrhea, unspecified: Secondary | ICD-10-CM | POA: Diagnosis not present

## 2014-09-18 NOTE — Patient Instructions (Addendum)
Push fluids. Rest, advance diet to bland solids and beyond as tolerated.  Can consider using a probiotic.Marland Kitchen Align or Culturelle (generic lactobaccilli).  Call if not continuing to improve, blood stool or new fever > 101.5 F.

## 2014-09-18 NOTE — Progress Notes (Signed)
Pre visit review using our clinic review tool, if applicable. No additional management support is needed unless otherwise documented below in the visit note. 

## 2014-09-18 NOTE — Assessment & Plan Note (Addendum)
Secondary to either viral GE vs traveler's diarrhea versus SE to amoxicllin.  Improving overtime, no red flags such as hematochezia.  Push fluids, rest, advance diet as tolerated.  Can try probiotic.

## 2014-09-18 NOTE — Progress Notes (Signed)
   Subjective:    Patient ID: Karla Ortega, female    DOB: 1950/12/24, 64 y.o.   MRN: [id number]  HPI    64 year old female pt presents with  new onset diarrhea in last week.  No known exposure, no sick contact. She did take amoxicillin x 4 tabs prior to dentist visit last week on 7/18. Symptoms started after this.  She has been out of country to [facility] 7/19- 7/12.She had no questionable issues. She did drinks ome tap water. Decreased appetite. Next day felt nausea, no emesis, diarrhea BMs loose 3-4 a day, urgency.  Drinking fluids, liquid diet until yesterday.  She felt better yesterday, stool not as loose. Yesterday afternoon returned to loose stool.  This AM firm stools, only 2 BM today. No abdominal pain., no hematochezia. No fever.      Review of Systems  Constitutional: Negative for fever and fatigue.  HENT: Negative for ear pain.   Eyes: Negative for pain.  Respiratory: Negative for chest tightness and shortness of breath.   Cardiovascular: Negative for chest pain, palpitations and leg swelling.  Gastrointestinal: Negative for abdominal pain.  Genitourinary: Negative for dysuria.       Objective:   Physical Exam  Constitutional: Vital signs are normal. She appears well-developed and well-nourished. She is cooperative.  Non-toxic appearance. She does not appear ill. No distress.  HENT:  Head: Normocephalic.  Right Ear: Hearing, tympanic membrane, external ear and ear canal normal. Tympanic membrane is not erythematous, not retracted and not bulging.  Left Ear: Hearing, tympanic membrane, external ear and ear canal normal. Tympanic membrane is not erythematous, not retracted and not bulging.  Nose: No mucosal edema or rhinorrhea. Right sinus exhibits no maxillary sinus tenderness and no frontal sinus tenderness. Left sinus exhibits no maxillary sinus tenderness and no frontal sinus tenderness.  Mouth/Throat: Uvula is midline, oropharynx is clear and moist and  mucous membranes are normal.  Eyes: Conjunctivae, EOM and lids are normal. Pupils are equal, round, and reactive to light. Lids are everted and swept, no foreign bodies found.  Neck: Trachea normal and normal range of motion. Neck supple. Carotid bruit is not present. No thyroid mass and no thyromegaly present.  Cardiovascular: Normal rate, regular rhythm, S1 normal, S2 normal, normal heart sounds, intact distal pulses and normal pulses.  Exam reveals no gallop and no friction rub.   No murmur heard. Pulmonary/Chest: Effort normal and breath sounds normal. No tachypnea. No respiratory distress. She has no decreased breath sounds. She has no wheezes. She has no rhonchi. She has no rales.  Abdominal: Soft. Normal appearance and bowel sounds are normal. There is no tenderness.  Neurological: She is alert.  Skin: Skin is warm, dry and intact. No rash noted.  Psychiatric: Her speech is normal and behavior is normal. Judgment and thought content normal. Her mood appears not anxious. Cognition and memory are normal. She does not exhibit a depressed mood.          Assessment & Plan:

## 2014-09-24 ENCOUNTER — Ambulatory Visit
Admission: RE | Admit: 2014-09-24 | Discharge: 2014-09-24 | Disposition: A | Discharge status: Home / Self Care | Payer: BC Managed Care – PPO | Source: Ambulatory Visit | Attending: Thoracic Surgery (Cardiothoracic Vascular Surgery) | Admitting: Thoracic Surgery (Cardiothoracic Vascular Surgery)

## 2014-09-24 DIAGNOSIS — I712 Thoracic aortic aneurysm, without rupture, unspecified: Secondary | ICD-10-CM

## 2014-09-24 MED ORDER — IOPAMIDOL (ISOVUE-370) INJECTION 76%
75.0000 mL | Freq: Once | INTRAVENOUS | Status: AC | PRN
  Administered 2014-09-24: 75 mL via INTRAVENOUS

## 2014-10-01 ENCOUNTER — Ambulatory Visit (INDEPENDENT_AMBULATORY_CARE_PROVIDER_SITE_OTHER): Payer: BC Managed Care – PPO | Admitting: Thoracic Surgery (Cardiothoracic Vascular Surgery)

## 2014-10-01 ENCOUNTER — Encounter: Payer: Self-pay | Admitting: Thoracic Surgery (Cardiothoracic Vascular Surgery)

## 2014-10-01 ENCOUNTER — Encounter: Payer: BC Managed Care – PPO | Admitting: Thoracic Surgery (Cardiothoracic Vascular Surgery)

## 2014-10-01 VITALS — BP 130/77 | HR 68 | Resp 20 | Ht 62.25 in | Wt 177.0 lb

## 2014-10-01 DIAGNOSIS — I7121 Aneurysm of the ascending aorta, without rupture: Secondary | ICD-10-CM

## 2014-10-01 DIAGNOSIS — I712 Thoracic aortic aneurysm, without rupture: Secondary | ICD-10-CM | POA: Diagnosis not present

## 2014-10-01 NOTE — Patient Instructions (Signed)
Continue to monitor your blood pressure on a regular basis
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
## 2014-10-11 ENCOUNTER — Emergency Department (HOSPITAL_COMMUNITY): Payer: BC Managed Care – PPO

## 2014-10-11 ENCOUNTER — Emergency Department (HOSPITAL_COMMUNITY)
Admission: EM | Admit: 2014-10-11 | Discharge: 2014-10-11 | Disposition: A | Discharge status: Home / Self Care | Payer: BC Managed Care – PPO | Attending: Emergency Medicine | Admitting: Emergency Medicine

## 2014-10-11 ENCOUNTER — Encounter (HOSPITAL_COMMUNITY): Payer: Self-pay | Admitting: Emergency Medicine

## 2014-10-11 DIAGNOSIS — R531 Weakness: Secondary | ICD-10-CM | POA: Diagnosis not present

## 2014-10-11 DIAGNOSIS — D649 Anemia, unspecified: Secondary | ICD-10-CM | POA: Diagnosis not present

## 2014-10-11 DIAGNOSIS — R55 Syncope and collapse: Secondary | ICD-10-CM | POA: Diagnosis not present

## 2014-10-11 DIAGNOSIS — Z79899 Other long term (current) drug therapy: Secondary | ICD-10-CM | POA: Diagnosis not present

## 2014-10-11 DIAGNOSIS — I10 Essential (primary) hypertension: Secondary | ICD-10-CM | POA: Insufficient documentation

## 2014-10-11 DIAGNOSIS — R5383 Other fatigue: Secondary | ICD-10-CM | POA: Insufficient documentation

## 2014-10-11 DIAGNOSIS — Z7982 Long term (current) use of aspirin: Secondary | ICD-10-CM | POA: Diagnosis not present

## 2014-10-11 LAB — URINALYSIS, ROUTINE W REFLEX MICROSCOPIC
Bilirubin Urine: NEGATIVE
Glucose, UA: NEGATIVE mg/dL
HGB URINE DIPSTICK: NEGATIVE
Ketones, ur: NEGATIVE mg/dL
LEUKOCYTES UA: NEGATIVE
NITRITE: NEGATIVE
PROTEIN: NEGATIVE mg/dL
Specific Gravity, Urine: 1.015 (ref 1.005–1.030)
UROBILINOGEN UA: 0.2 mg/dL (ref 0.0–1.0)
pH: 6.5 (ref 5.0–8.0)

## 2014-10-11 LAB — CBC
HCT: 35.7 % — ABNORMAL LOW (ref 36.0–46.0)
HEMOGLOBIN: 11.1 g/dL — AB (ref 12.0–15.0)
MCH: 28.4 pg (ref 26.0–34.0)
MCHC: 31.1 g/dL (ref 30.0–36.0)
MCV: 91.3 fL (ref 78.0–100.0)
Platelets: 259 10*3/uL (ref 150–400)
RBC: 3.91 MIL/uL (ref 3.87–5.11)
RDW: 15.5 % (ref 11.5–15.5)
WBC: 7.8 10*3/uL (ref 4.0–10.5)

## 2014-10-11 LAB — BASIC METABOLIC PANEL
ANION GAP: 10 (ref 5–15)
BUN: 6 mg/dL (ref 6–20)
CALCIUM: 9.2 mg/dL (ref 8.9–10.3)
CO2: 25 mmol/L (ref 22–32)
Chloride: 107 mmol/L (ref 101–111)
Creatinine, Ser: 0.76 mg/dL (ref 0.44–1.00)
Glucose, Bld: 154 mg/dL — ABNORMAL HIGH (ref 65–99)
Potassium: 3.9 mmol/L (ref 3.5–5.1)
SODIUM: 142 mmol/L (ref 135–145)

## 2014-10-11 MED ORDER — SODIUM CHLORIDE 0.9 % IV BOLUS (SEPSIS)
500.0000 mL | Freq: Once | INTRAVENOUS | Status: AC
  Administered 2014-10-11: 500 mL via INTRAVENOUS

## 2014-10-11 MED ORDER — SODIUM CHLORIDE 0.9 % IV SOLN
INTRAVENOUS | Status: DC
  Administered 2014-10-11: 18:00:00 via INTRAVENOUS

## 2014-10-11 NOTE — ED Notes (Signed)
Patient transported to X-ray 

## 2014-10-11 NOTE — ED Notes (Signed)
MD at bedside. 

## 2014-10-11 NOTE — Discharge Instructions (Signed)
Return for any recurrent near passing out or passing out. Follow-up with your doctor as needed. Would recommend staying out of the heat for the next 2 days.

## 2014-10-11 NOTE — ED Provider Notes (Signed)
CSN: [id number]     Arrival date & time 10/11/14  1647 History   First MD Initiated Contact with Patient 10/11/14 1648     Chief Complaint  Patient presents with  . Near Syncope     (Consider location/radiation/quality/duration/timing/severity/associated sxs/prior Treatment) Patient is a 64 y.o. female presenting with near-syncope. The history is provided by the patient and the EMS personnel.  Near Syncope Pertinent negatives include no chest pain, no abdominal pain, no headaches and no shortness of breath.   patient brought in by EMS. Patient was at the local golf tournament. I started to feel weak and hot and felt as if she was going to pass out never completely went out according to her friends and family. Patient was noted to be orthostatic by EMS at the scene blood sugar was 138. Patient was given 500 mL normal saline bolus on the way in patient upon arrival was feeling much better. Patient denied headache chest pain shortness of breath abdominal pain nausea or vomiting patient denied any injuries from when she went down actually her friends helped her down slowly.  Past Medical History  Diagnosis Date  . Hypertension    Past Surgical History  Procedure Laterality Date  . Partial hysterectomy      has left ovary.  Tubal pregnancy   Family History  Problem Relation Age of Onset  . Hypertension Mother   . Arthritis Mother     osteoarthritis  . Coronary artery disease Father     MI age 46  . Hypertension Father   . Deep vein thrombosis Father   . COPD Neg Hx    Social History  Substance Use Topics  . Smoking status: Never Smoker   . Smokeless tobacco: Never Used  . Alcohol Use: No   OB History    No data available     Review of Systems  Constitutional: Positive for fatigue.  HENT: Negative for congestion.   Eyes: Negative for visual disturbance.  Respiratory: Negative for shortness of breath.   Cardiovascular: Positive for near-syncope. Negative for chest pain.   Gastrointestinal: Negative for nausea, vomiting and abdominal pain.  Genitourinary: Negative for dysuria.  Musculoskeletal: Negative for back pain and neck pain.  Skin: Negative for rash.  Neurological: Positive for weakness. Negative for headaches.  Hematological: Does not bruise/bleed easily.      Allergies  Review of patient's allergies indicates no known allergies.  Home Medications   Prior to Admission medications   Medication Sig Start Date End Date Taking? Authorizing Provider  aspirin 81 MG tablet Take 81 mg by mouth daily.      Historical Provider, MD  CINNAMON PO Take 1 tablet by mouth daily.     Historical Provider, MD  Fish Oil OIL Take one capsule by mouth once a day     Historical Provider, MD  Garlic (ODOR FREE GARLIC) 100 MG TABS Take one by mouth daily     Historical Provider, MD  glucose blood (ACCU-CHEK SMARTVIEW) test strip Use to check blood sugar once daily and as needed.  Dx: E11.9 07/07/14   Amy Michelle Nasuti, MD  Lancets (ACCU-CHEK MULTICLIX) lancets Use as instructed     Historical Provider, MD  lisinopril-hydrochlorothiazide (PRINZIDE,ZESTORETIC) 20-12.5 MG per tablet TAKE 1 TABLET BY MOUTH DAILY. 08/21/14   Amy Michelle Nasuti, MD  Multiple Vitamin (MULTIVITAMIN) tablet Take 1 tablet by mouth daily.      Historical Provider, MD  Red Yeast Rice 600 MG CAPS Take 1 capsule by  mouth daily.     Historical Provider, MD  Vitamin D, Ergocalciferol, (DRISDOL) 50000 UNITS CAPS capsule Take 1 capsule (50,000 Units total) by mouth every 7 (seven) days. 08/31/14   Amy E Bedsole, MD   BP 146/69 mmHg  Pulse 62  Temp(Src) 98.3 F (36.8 C) (Oral)  Resp 16  SpO2 100% Physical Exam  Constitutional: She is oriented to person, place, and time. She appears well-developed and well-nourished. No distress.  HENT:  Head: Normocephalic and atraumatic.  Mouth/Throat: Oropharynx is clear and moist.  Eyes: Conjunctivae and EOM are normal. Pupils are equal, round, and reactive to light.   Neck: Normal range of motion. Neck supple.  Cardiovascular: Normal rate, regular rhythm and normal heart sounds.   No murmur heard. Pulmonary/Chest: Effort normal and breath sounds normal. No respiratory distress.  Abdominal: Soft. Bowel sounds are normal. There is no tenderness.  Musculoskeletal: Normal range of motion. She exhibits no edema or tenderness.  Neurological: She is alert and oriented to person, place, and time. No cranial nerve deficit. She exhibits normal muscle tone. Coordination normal.  Skin: Skin is warm. No rash noted.  Nursing note and vitals reviewed.   ED Course  Procedures (including critical care time) Labs Review Labs Reviewed  BASIC METABOLIC PANEL - Abnormal; Notable for the following:    Glucose, Bld 154 (*)    All other components within normal limits  CBC - Abnormal; Notable for the following:    Hemoglobin 11.1 (*)    HCT 35.7 (*)    All other components within normal limits  URINALYSIS, ROUTINE W REFLEX MICROSCOPIC (NOT AT [facility])  CBG MONITORING, ED   Results for orders placed or performed during the hospital encounter of 10/11/14  Basic metabolic panel  Result Value Ref Range   Sodium 142 135 - 145 mmol/L   Potassium 3.9 3.5 - 5.1 mmol/L   Chloride 107 101 - 111 mmol/L   CO2 25 22 - 32 mmol/L   Glucose, Bld 154 (H) 65 - 99 mg/dL   BUN 6 6 - 20 mg/dL   Creatinine, Ser 1.61 0.44 - 1.00 mg/dL   Calcium 9.2 8.9 - 09.6 mg/dL   GFR calc non Af Amer >60 >60 mL/min   GFR calc Af Amer >60 >60 mL/min   Anion gap 10 5 - 15  CBC  Result Value Ref Range   WBC 7.8 4.0 - 10.5 K/uL   RBC 3.91 3.87 - 5.11 MIL/uL   Hemoglobin 11.1 (L) 12.0 - 15.0 g/dL   HCT 04.5 (L) 40.9 - 81.1 %   MCV 91.3 78.0 - 100.0 fL   MCH 28.4 26.0 - 34.0 pg   MCHC 31.1 30.0 - 36.0 g/dL   RDW 91.4 78.2 - 95.6 %   Platelets 259 150 - 400 K/uL  Urinalysis, Routine w reflex microscopic (not at [facility])  Result Value Ref Range   Color, Urine YELLOW YELLOW   APPearance CLEAR CLEAR    Specific Gravity, Urine 1.015 1.005 - 1.030   pH 6.5 5.0 - 8.0   Glucose, UA NEGATIVE NEGATIVE mg/dL   Hgb urine dipstick NEGATIVE NEGATIVE   Bilirubin Urine NEGATIVE NEGATIVE   Ketones, ur NEGATIVE NEGATIVE mg/dL   Protein, ur NEGATIVE NEGATIVE mg/dL   Urobilinogen, UA 0.2 0.0 - 1.0 mg/dL   Nitrite NEGATIVE NEGATIVE   Leukocytes, UA NEGATIVE NEGATIVE     Imaging Review Dg Chest 2 View  10/11/2014   CLINICAL DATA:  Near syncope today.  Dizziness and lightheadedness.  EXAM: CHEST  2 VIEW  COMPARISON:  08/21/2013 and chest CTA dated 09/24/2014.  FINDINGS: Stable borderline enlarged cardiac silhouette. Clear lungs. Thoracic spine degenerative changes.  IMPRESSION: No acute abnormality.   Electronically Signed   By: Beckie Salts M.D.   On: 10/11/2014 19:01   I have personally reviewed and evaluated these images and lab results as part of my medical decision-making.   EKG Interpretation   Date/Time:  Thursday October 11 2014 16:53:32 EDT Ventricular Rate:  66 PR Interval:  138 QRS Duration: 69 QT Interval:  363 QTC Calculation: 380 R Axis:   48 Text Interpretation:  Sinus rhythm Borderline T abnormalities, diffuse  leads No significant change since last tracing Confirmed by Charlayne Vultaggio  MD,  Ireta Pullman [phone]) on 10/11/2014 4:59:28 PM      MDM   Final diagnoses:  Vasovagal near syncope    Patient with near syncopal episode while at the golf tournament of did get hot most likely vasovagal. Patient's workup here without any acute findings cardiac monitoring without any arrhythmia chest x-rays negative EKG without any acute cardiac changes no significant electrolyte abnormalities. Mild anemia with a hemoglobin of 11.1 but not very significant urinalysis is negative. Patient now feels fine and is no longer symptomatic.    Vanetta Mulders, MD 10/11/14 2014

## 2014-10-11 NOTE — ED Notes (Addendum)
Pt arrives via [facility] for syncopal episode. Pt was at a golf tournament, was leaning against a table, started to feel weak, was lowered to ground and had syncopal episode that lasted 15-20 seconds. Pt was orthostatic with ems. CBG 138. Received of ns. Pt alert, oriented x 4, nad.

## 2014-12-10 ENCOUNTER — Other Ambulatory Visit: Payer: Self-pay | Admitting: Family Medicine

## 2014-12-13 ENCOUNTER — Telehealth: Payer: Self-pay | Admitting: Family Medicine

## 2014-12-13 NOTE — Telephone Encounter (Signed)
Let pt know I see no reason in chart she need handicap plaquard from last OV... Ask her what the issue is, please.

## 2014-12-13 NOTE — Telephone Encounter (Signed)
Pt called needed to get her handicap sticker renewed Can you fill out form and let her know when this is ready She stated dr Ermalene Searingbedsole has forms
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
## 2014-12-13 NOTE — Telephone Encounter (Signed)
Handicap Placard Application placed in Dr. Bedsole's in box for signature. 

## 2014-12-20 NOTE — Telephone Encounter (Signed)
Dr. Ermalene SearingBedsole, Do you recall what happed to this Handicap Placard.  I never got it back and it is not in your in box?

## 2014-12-20 NOTE — Telephone Encounter (Signed)
Handicap application was scanned into chart and per Robin's note it was mailed to patient per her request on 12/14/2014.

## 2014-12-20 NOTE — Telephone Encounter (Signed)
Pt is calling requesting an update for handicap sticker, pt wants to pick up today at 2 when she is here for husbands appt.  cb number is [phone] Thank you

## 2015-03-20 ENCOUNTER — Telehealth: Payer: Self-pay | Admitting: Family Medicine

## 2015-03-20 MED ORDER — ACCU-CHEK NANO SMARTVIEW W/DEVICE KIT: PACK | Status: DC

## 2015-03-20 NOTE — Telephone Encounter (Signed)
Pt would like a rx called in for accu-check nano to [facility] [address].  Pt has a coupon that allows her to get meter at no cost and already has strips for it.  cb number is [phone]

## 2015-03-20 NOTE — Telephone Encounter (Signed)
Left message for Karla Ortega that prescription for Accu-Chek Nano Meter has been sent to [facility] [address] as requested.

## 2015-03-20 NOTE — Telephone Encounter (Signed)
Pt aware.

## 2015-03-29 ENCOUNTER — Telehealth: Payer: Self-pay | Admitting: Family Medicine

## 2015-03-29 NOTE — Telephone Encounter (Signed)
Please call and schedule Diabetic Follow up with labs prior for Dr. Ermalene Searing.

## 2015-04-01 NOTE — Telephone Encounter (Signed)
Left message asking pt to call office  °

## 2015-04-03 NOTE — Telephone Encounter (Signed)
Okey Regal with [facility] left v/m requesting cb about prior auth for test strips.

## 2015-04-04 ENCOUNTER — Other Ambulatory Visit: Payer: Self-pay | Admitting: *Deleted

## 2015-04-04 MED ORDER — GLUCOSE BLOOD VI STRP: ORAL_STRIP | Status: DC

## 2015-04-04 MED ORDER — ONETOUCH ULTRA SYSTEM W/DEVICE KIT: PACK | Status: DC

## 2015-04-04 MED ORDER — ONETOUCH ULTRASOFT LANCETS MISC: Status: AC

## 2015-04-04 NOTE — Telephone Encounter (Signed)
Sp[facility] at [facility].  Advised patient was called last week and ask that she check with her insurance to see what meter and test strips they will cover or she can pay for the Accu-Check Nano Smartview strips out of pocket.   Patient has not called me back about what brand her insurance will cover.

## 2015-04-05 NOTE — Telephone Encounter (Signed)
Left message asking pt to call office  °

## 2015-04-05 NOTE — Telephone Encounter (Signed)
Appointment 2/21 pt wanted labs same day

## 2015-04-12 ENCOUNTER — Other Ambulatory Visit: Payer: Self-pay | Admitting: *Deleted

## 2015-04-12 MED ORDER — GLUCOSE BLOOD VI STRP: ORAL_STRIP | Status: DC

## 2015-04-16 ENCOUNTER — Encounter: Payer: Self-pay | Admitting: Family Medicine

## 2015-04-16 ENCOUNTER — Other Ambulatory Visit: Payer: Self-pay | Admitting: Family Medicine

## 2015-04-16 ENCOUNTER — Ambulatory Visit (INDEPENDENT_AMBULATORY_CARE_PROVIDER_SITE_OTHER): Payer: BC Managed Care – PPO | Admitting: Family Medicine

## 2015-04-16 VITALS — BP 110/64 | HR 67 | Temp 98.5°F | Ht 62.25 in | Wt 181.0 lb

## 2015-04-16 DIAGNOSIS — I10 Essential (primary) hypertension: Secondary | ICD-10-CM | POA: Diagnosis not present

## 2015-04-16 DIAGNOSIS — E119 Type 2 diabetes mellitus without complications: Secondary | ICD-10-CM | POA: Diagnosis not present

## 2015-04-16 DIAGNOSIS — Z1159 Encounter for screening for other viral diseases: Secondary | ICD-10-CM

## 2015-04-16 DIAGNOSIS — E7800 Pure hypercholesterolemia, unspecified: Secondary | ICD-10-CM

## 2015-04-16 LAB — COMPREHENSIVE METABOLIC PANEL
ALBUMIN: 4.5 g/dL (ref 3.5–5.2)
ALK PHOS: 70 U/L (ref 39–117)
ALT: 19 U/L (ref 0–35)
AST: 21 U/L (ref 0–37)
BILIRUBIN TOTAL: 0.3 mg/dL (ref 0.2–1.2)
BUN: 11 mg/dL (ref 6–23)
CO2: 30 mEq/L (ref 19–32)
CREATININE: 0.75 mg/dL (ref 0.40–1.20)
Calcium: 10.3 mg/dL (ref 8.4–10.5)
Chloride: 102 mEq/L (ref 96–112)
GFR: 100.11 mL/min (ref >60.00)
GLUCOSE: 91 mg/dL (ref 70–99)
Potassium: 3.4 mEq/L — ABNORMAL LOW (ref 3.5–5.1)
SODIUM: 138 meq/L (ref 135–145)
TOTAL PROTEIN: 7.8 g/dL (ref 6.0–8.3)

## 2015-04-16 LAB — LIPID PANEL
CHOLESTEROL: 207 mg/dL — AB (ref 0–200)
HDL: 73.5 mg/dL (ref >39.00)
LDL Cholesterol: 116 mg/dL — ABNORMAL HIGH (ref 0–99)
NONHDL: 133.92
Total CHOL/HDL Ratio: 3
Triglycerides: 92 mg/dL (ref 0.0–149.0)
VLDL: 18.4 mg/dL (ref 0.0–40.0)

## 2015-04-16 LAB — HM DIABETES FOOT EXAM

## 2015-04-16 LAB — HEMOGLOBIN A1C: HEMOGLOBIN A1C: 6.6 % — AB (ref 4.6–6.5)

## 2015-04-16 NOTE — Progress Notes (Signed)
Pre visit review using our clinic review tool, if applicable. No additional management support is needed unless otherwise documented below in the visit note. 

## 2015-04-16 NOTE — Assessment & Plan Note (Signed)
Due for re-eval. Encouraged exercise, weight loss, healthy eating habits.  

## 2015-04-16 NOTE — Assessment & Plan Note (Signed)
Well controlled. Continue current medication.  

## 2015-04-16 NOTE — Progress Notes (Signed)
65 year old female presents for 6 month DM follow up.  Hypertension: Well controlled on lisinopril/HCTZ.  BP Readings from Last 3 Encounters:  04/16/15 110/64  10/11/14 131/58  10/01/14 130/77   Using medication without problems or lightheadedness: None  Chest pain with exertion:None  Edema:None  Short of breath:None  Average home BPs: 130-140/70 Other issues:   Diabetes: Well controlled on no med in past due for lab eval. Lab Results  Component Value Date   HGBA1C 6.4 08/30/2014  Using medications without difficulties:  Hypoglycemic episodes:None  Hyperglycemic episodes:None  Feet problems: None  Blood Sugars averaging: FBS 91, 2 hour post prandial 118 eye exam within last year: yes  Elevated Cholesterol: LDL at goal < 100 on red yeast rice  in past.. Due for re-eval. Lab Results  Component Value Date   CHOL 191 08/30/2014   HDL 66.20 08/30/2014   LDLCALC 102* 08/30/2014   LDLDIRECT 125.3 02/23/2012   TRIG 115.0 08/30/2014   CHOLHDL 3 08/30/2014  Diet compliance: Good, sticking to DM diet  Exercise:walking, she using a pedometer Other complaints:   Review of Systems  Constitutional: Negative for fever, fatigue and unexpected weight change.  HENT: Negative for ear pain, congestion, sore throat, sneezing, trouble swallowing and sinus pressure.  Eyes: Negative for pain and itching.  Respiratory: Negative for cough, shortness of breath and wheezing.  Cardiovascular: Negative for chest pain, palpitations and leg swelling.  Gastrointestinal: Negative for nausea, abdominal pain, diarrhea, constipation and blood in stool.  Genitourinary: Negative for dysuria, hematuria, vaginal discharge, difficulty urinating and menstrual problem.  Skin: Negative for rash.  Neurological: Negative for syncope, weakness, light-headedness, numbness and headaches.  Psychiatric/Behavioral: Negative for confusion and dysphoric mood. The patient is not nervous/anxious.   Objective:   Physical Exam  Constitutional: Vital signs are normal. She appears well-developed and well-nourished. She is cooperative. Non-toxic appearance. She does not appear ill. No distress.  HENT:  Head: Normocephalic.  Right Ear: Hearing, tympanic membrane, external ear and ear canal normal.  Left Ear: Hearing, tympanic membrane, external ear and ear canal normal.  Nose: Nose normal.  Eyes: Conjunctivae normal, EOM and lids are normal. Pupils are equal, round, and reactive to light. No foreign bodies found.  Neck: Trachea normal and normal range of motion. Neck supple. Carotid bruit is not present. No mass and no thyromegaly present.  Cardiovascular: Normal rate, regular rhythm, S1 normal, S2 normal, normal heart sounds and intact distal pulses. Exam reveals no gallop.  No murmur heard.  Pulmonary/Chest: Effort normal and breath sounds normal. No respiratory distress. She has no wheezes. She has no rhonchi. She has no rales.  Abdominal: Soft. Normal appearance and bowel sounds are normal. She exhibits no distension, no fluid wave, no abdominal bruit and no mass. There is no hepatosplenomegaly. There is no tenderness. There is no rebound, no guarding and no CVA tenderness. No hernia.  Genitourinary: No breast swelling, tenderness, discharge or bleeding. Right adnexum displays no mass, no tenderness and no fullness. Left adnexum displays no mass, no tenderness and no fullness.  Lymphadenopathy:  She has no cervical adenopathy.  She has no axillary adenopathy.  Neurological: She is alert. She has normal strength. No cranial nerve deficit or sensory deficit.  Skin: Skin is warm, dry and intact. No rash noted.  Psychiatric: Her speech is normal and behavior is normal. Judgment normal. Her mood appears not anxious. Cognition and memory are normal. She does not exhibit a depressed mood.   Diabetic foot exam:  Normal inspection  No skin breakdown  No calluses  Normal DP  pulses  Normal sensation to light touch and monofilament  Nails normal

## 2015-04-16 NOTE — Patient Instructions (Addendum)
Stop at lab on way out. Keep working on healthy eating, low carb diet, regular exercise and weight loss.

## 2015-04-17 ENCOUNTER — Encounter: Payer: Self-pay | Admitting: *Deleted

## 2015-04-17 LAB — HEPATITIS C ANTIBODY: HCV Ab: NEGATIVE

## 2015-04-18 ENCOUNTER — Encounter: Payer: Self-pay | Admitting: *Deleted

## 2015-05-27 ENCOUNTER — Other Ambulatory Visit: Payer: Self-pay | Admitting: Family Medicine

## 2015-05-27 NOTE — Telephone Encounter (Signed)
Pt request status of refill for lisinopril hctz; advised [facility] and was approved; pt said she is back home now and wants rx [facility] [address]. Pt will contact [facility] [address] to [facility].

## 2015-05-29 ENCOUNTER — Other Ambulatory Visit: Payer: Self-pay | Admitting: *Deleted

## 2015-05-29 MED ORDER — LISINOPRIL-HYDROCHLOROTHIAZIDE 20-12.5 MG PO TABS: 1.0000 | ORAL_TABLET | Freq: Every day | ORAL | Status: DC

## 2015-07-09 ENCOUNTER — Telehealth: Payer: Self-pay | Admitting: Family Medicine

## 2015-07-09 DIAGNOSIS — M8580 Other specified disorders of bone density and structure, unspecified site: Secondary | ICD-10-CM

## 2015-07-09 NOTE — Telephone Encounter (Signed)
Pt requesting order for bone density. Pt is going to [facility]..   Pt call back number is [phone]  Thank you

## 2015-07-09 NOTE — Telephone Encounter (Signed)
Ordered DEXa.

## 2015-07-11 NOTE — Telephone Encounter (Signed)
5/25 pt aware

## 2015-08-20 ENCOUNTER — Encounter: Payer: Self-pay | Admitting: Family Medicine

## 2015-08-22 ENCOUNTER — Telehealth: Payer: Self-pay | Admitting: Family Medicine

## 2015-08-22 ENCOUNTER — Encounter: Payer: Self-pay | Admitting: Family Medicine

## 2015-08-22 NOTE — Telephone Encounter (Signed)
Dexa Scan results discussed with patient.  See Result Note.

## 2015-08-22 NOTE — Telephone Encounter (Signed)
Patient returned Donna's call. °

## 2015-09-05 ENCOUNTER — Other Ambulatory Visit: Payer: Self-pay | Admitting: *Deleted

## 2015-09-05 DIAGNOSIS — I712 Thoracic aortic aneurysm, without rupture: Secondary | ICD-10-CM

## 2015-09-05 DIAGNOSIS — I7121 Aneurysm of the ascending aorta, without rupture: Secondary | ICD-10-CM

## 2015-10-15 ENCOUNTER — Encounter: Payer: BC Managed Care – PPO | Admitting: Family Medicine

## 2015-10-15 ENCOUNTER — Ambulatory Visit (INDEPENDENT_AMBULATORY_CARE_PROVIDER_SITE_OTHER): Payer: BC Managed Care – PPO | Admitting: Family Medicine

## 2015-10-15 ENCOUNTER — Encounter: Payer: Self-pay | Admitting: Family Medicine

## 2015-10-15 VITALS — BP 118/60 | HR 66 | Temp 98.3°F | Ht 62.25 in | Wt 180.2 lb

## 2015-10-15 DIAGNOSIS — Z0000 Encounter for general adult medical examination without abnormal findings: Secondary | ICD-10-CM | POA: Diagnosis not present

## 2015-10-15 DIAGNOSIS — I10 Essential (primary) hypertension: Secondary | ICD-10-CM | POA: Diagnosis not present

## 2015-10-15 DIAGNOSIS — E119 Type 2 diabetes mellitus without complications: Secondary | ICD-10-CM | POA: Diagnosis not present

## 2015-10-15 DIAGNOSIS — R600 Localized edema: Secondary | ICD-10-CM | POA: Insufficient documentation

## 2015-10-15 DIAGNOSIS — R609 Edema, unspecified: Secondary | ICD-10-CM

## 2015-10-15 DIAGNOSIS — E7800 Pure hypercholesterolemia, unspecified: Secondary | ICD-10-CM

## 2015-10-15 LAB — HEMOGLOBIN A1C: HEMOGLOBIN A1C: 6.4 % (ref 4.6–6.5)

## 2015-10-15 LAB — COMPREHENSIVE METABOLIC PANEL
ALK PHOS: 72 U/L (ref 39–117)
ALT: 19 U/L (ref 0–35)
AST: 20 U/L (ref 0–37)
Albumin: 4.5 g/dL (ref 3.5–5.2)
BILIRUBIN TOTAL: 0.3 mg/dL (ref 0.2–1.2)
BUN: 11 mg/dL (ref 6–23)
CO2: 27 mEq/L (ref 19–32)
CREATININE: 0.8 mg/dL (ref 0.40–1.20)
Calcium: 9.7 mg/dL (ref 8.4–10.5)
Chloride: 103 mEq/L (ref 96–112)
GFR: 92.78 mL/min (ref >60.00)
GLUCOSE: 106 mg/dL — AB (ref 70–99)
Potassium: 3.9 mEq/L (ref 3.5–5.1)
SODIUM: 138 meq/L (ref 135–145)
TOTAL PROTEIN: 7.9 g/dL (ref 6.0–8.3)

## 2015-10-15 LAB — LIPID PANEL
Cholesterol: 210 mg/dL — ABNORMAL HIGH (ref 0–200)
HDL: 75.3 mg/dL (ref >39.00)
LDL Cholesterol: 112 mg/dL — ABNORMAL HIGH (ref 0–99)
NONHDL: 134.93
Total CHOL/HDL Ratio: 3
Triglycerides: 116 mg/dL (ref 0.0–149.0)
VLDL: 23.2 mg/dL (ref 0.0–40.0)

## 2015-10-15 MED ORDER — HYDROCHLOROTHIAZIDE 25 MG PO TABS: 25.0000 mg | ORAL_TABLET | Freq: Every day | ORAL | 3 refills | Status: DC

## 2015-10-15 NOTE — Assessment & Plan Note (Signed)
Due for re-eval. 

## 2015-10-15 NOTE — Assessment & Plan Note (Addendum)
Well controlled, but she feels some swelling from the medication.  Will have her stop lisinopril and increase HCTZ to 25 mg daily. Follow BP at home.

## 2015-10-15 NOTE — Progress Notes (Signed)
Pre visit review using our clinic review tool, if applicable. No additional management support is needed unless otherwise documented below in the visit note. 

## 2015-10-15 NOTE — Patient Instructions (Addendum)
Stop at lab on way out.  Stop lisinopril/HCTZ and change to HCTZ 25 mg alone daily.  Work on weight loss, exercise, can elevated ankles and wear compression hose if on feet.

## 2015-10-15 NOTE — Assessment & Plan Note (Signed)
Likely due to venous insufficiency. Pt feels could be due to lisinopril as limited on POI as SE. She wishes to changes meds. Change to HCTZ alone, higher dose  Recommended elevation, weihgt loss, exercise and compression hose.

## 2015-10-15 NOTE — Progress Notes (Signed)
The patient is here for annual wellness exam and preventative care.   Hypertension: Well controlled on lisinopril HCTZ.  BP Readings from Last 3 Encounters:  10/15/15 118/60  04/16/15 110/64  10/11/14 131/58  Using medication without problems or lightheadedness: None  Chest pain with exertion:None  Edema: Swelling bilateral ankles later in day, if on feet a lot, in heat. Noticed more  In last few months. Short of breath:None  Average home BPs: 115/60s Other issues:   Diabetes: Due for re-eval. On no med.  Lab Results  Component Value Date   HGBA1C 6.6 (H) 04/16/2015  Using medications without difficulties:  Hypoglycemic episodes:None  Hyperglycemic episodes:None  Feet problems: None  Blood Sugars averaging: FBS 89-119 2 hour post prandial 120 eye exam within last year: yes  Elevated Cholesterol:  Due for labs re-eval.  Lab Results  Component Value Date   CHOL 207 (H) 04/16/2015   HDL 73.50 04/16/2015   LDLCALC 116 (H) 04/16/2015   LDLDIRECT 125.3 02/23/2012   TRIG 92.0 04/16/2015   CHOLHDL 3 04/16/2015  Diet compliance: Good, sticking to DM diet  Exercise:walking occ  Other complaints:   She is having difficult losing weight. She has tried slimfast,  Boneta LucksJenny craig,healthy  eating. Wt Readings from Last 3 Encounters:  10/15/15 180 lb 4 oz (81.8 kg)  04/16/15 181 lb (82.1 kg)  10/01/14 177 lb (80.3 kg)   Social History /Family History/Past Medical History reviewed and updated if needed.   Review of Systems  Constitutional: Negative for fever, fatigue and unexpected weight change.  HENT: Negative for ear pain, congestion, sore throat, sneezing, trouble swallowing and sinus pressure.  Eyes: Negative for pain and itching.  Respiratory: Negative for cough, shortness of breath and wheezing.  Cardiovascular: Negative for chest pain, palpitations and leg swelling.  Gastrointestinal: Negative for nausea, abdominal pain, diarrhea, constipation and blood  in stool. Genitourinary: Negative for dysuria, hematuria, vaginal discharge, difficulty urinating and menstrual problem.  Skin: Negative for rash.  Neurological: Negative for syncope, weakness, light-headedness, numbness and headaches.  Psychiatric/Behavioral: Negative for confusion and dysphoric mood. The patient is not nervous/anxious.  Objective:   Physical Exam  Constitutional: Vital signs are normal. She appears well-developed and well-nourished. She is cooperative. Non-toxic appearance. She does not appear ill. No distress.  HENT:  Head: Normocephalic.  Right Ear: Hearing, tympanic membrane, external ear and ear canal normal.  Left Ear: Hearing, tympanic membrane, external ear and ear canal normal.  Nose: Nose normal.  Eyes: Conjunctivae normal, EOM and lids are normal. Pupils are equal, round, and reactive to light. No foreign bodies found.  Neck: Trachea normal and normal range of motion. Neck supple. Carotid bruit is not present. No mass and no thyromegaly present.  Cardiovascular: Normal rate, regular rhythm, S1 normal, S2 normal, normal heart sounds and intact distal pulses. Exam reveals no gallop.  No murmur heard.  Mild non pitting  peripheral edema on exam, bilateral varicosities. Pulmonary/Chest: Effort normal and breath sounds normal. No respiratory distress. She has no wheezes. She has no rhonchi. She has no rales.  Abdominal: Soft. Normal appearance and bowel sounds are normal. She exhibits no distension, no fluid wave, no abdominal bruit and no mass. There is no hepatosplenomegaly. There is no tenderness. There is no rebound, no guarding and no CVA tenderness. No hernia.  Genitourinary: Not performed,will do every other year. Lymphadenopathy:  She has no cervical adenopathy.  She has no axillary adenopathy.  Neurological: She is alert. She has normal strength. No cranial  nerve deficit or sensory deficit.  Skin: Skin is warm, dry and intact. No rash noted.   Psychiatric: Her speech is normal and behavior is normal. Judgment normal. Her mood appears not anxious. Cognition and memory are normal. She does not exhibit a depressed mood.    Diabetic foot exam:  Normal inspection  No skin breakdown  No calluses  Normal DP pulses  Normal sensation to light touch and monofilament  Nails normal   Assessment & Plan:   The patient's preventative maintenance and recommended screening tests for an annual wellness exam were reviewed in full today.  Brought up to date unless services declined.  Counselled on the importance of diet, exercise, and its role in overall health and mortality.  The patient's FH and SH was reviewed, including their home life, tobacco status, and drug and alcohol status.   Vaccines Uptodate with TDap, refused flu, PNA and shingles  Nonsmoker.  Colon: 09/07/2014 Dr.Hans, plan repeat in 10 years Mammo:nml 07/2015 PAP/DVE: partial hysterectomy , has left ovary, no cervix. NO PAP, but every otheryear DVE   DEXA: Osteopenia, improved 09/2010. Stopped fosamax after being on it for 3 years. Last check 2017.Marland Kitchen. Showed slightly worsened osteopenia, repeating in 2 years.

## 2015-10-16 ENCOUNTER — Encounter: Payer: Self-pay | Admitting: *Deleted

## 2015-10-21 ENCOUNTER — Encounter: Payer: Self-pay | Admitting: Thoracic Surgery (Cardiothoracic Vascular Surgery)

## 2015-10-21 ENCOUNTER — Ambulatory Visit
Admission: RE | Admit: 2015-10-21 | Discharge: 2015-10-21 | Disposition: A | Discharge status: Home / Self Care | Payer: BC Managed Care – PPO | Source: Ambulatory Visit | Attending: Thoracic Surgery (Cardiothoracic Vascular Surgery) | Admitting: Thoracic Surgery (Cardiothoracic Vascular Surgery)

## 2015-10-21 ENCOUNTER — Telehealth: Payer: Self-pay

## 2015-10-21 ENCOUNTER — Ambulatory Visit (INDEPENDENT_AMBULATORY_CARE_PROVIDER_SITE_OTHER): Payer: BC Managed Care – PPO | Admitting: Thoracic Surgery (Cardiothoracic Vascular Surgery)

## 2015-10-21 VITALS — BP 120/68 | HR 60 | Resp 20 | Ht 62.25 in | Wt 180.0 lb

## 2015-10-21 DIAGNOSIS — I7121 Aneurysm of the ascending aorta, without rupture: Secondary | ICD-10-CM

## 2015-10-21 DIAGNOSIS — I712 Thoracic aortic aneurysm, without rupture: Secondary | ICD-10-CM

## 2015-10-21 MED ORDER — IOPAMIDOL (ISOVUE-370) INJECTION 76%
75.0000 mL | Freq: Once | INTRAVENOUS | Status: AC | PRN
  Administered 2015-10-21: 75 mL via INTRAVENOUS

## 2015-10-21 NOTE — Progress Notes (Signed)
[address]       ,[place] [address]             [phone]     CARDIOTHORACIC SURGERY OFFICE NOTE  Referring Provider is Jinny Sanders, MD PCP is Eliezer Lofts, MD   HPI:  Patient is a 65 year old obese African-American female with history of hypertension and type 2 diabetes mellitus who returns to the office today for routine follow-up and surveillance of mild aneurysmal enlargement of the ascending thoracic aorta.  She was last seen here in our office on 10/01/2014. Since then she has done well. She continues to follow up regularly with Dr. Diona Browner. She has not developed any new health problems and she reports that her blood pressure has remained under good control. She denies any symptoms of chest pain or chest tightness. The remainder of her review of systems is unremarkable.   Current Outpatient Prescriptions  Medication Sig Dispense Refill  . aspirin 81 MG tablet Take 81 mg by mouth daily.      . Blood Glucose Monitoring Suppl (ONE TOUCH ULTRA SYSTEM KIT) w/Device KIT Use to check blood sugar once daily and as needed. Dx: E11.9 1 each 0  . Cholecalciferol (VITAMIN D PO) Take 1 tablet by mouth daily.    Marland Kitchen CINNAMON PO Take 1 tablet by mouth daily.     . Fish Oil OIL Take one capsule by mouth once a day     . Garlic (ODOR FREE GARLIC) 628 MG TABS Take one by mouth daily     . glucose blood (ONETOUCH VERIO) test strip Use to check blood sugar daily.  Dx: E11.9 100 each 3  . hydrochlorothiazide (HYDRODIURIL) 25 MG tablet Take 1 tablet (25 mg total) by mouth daily. 90 tablet 3  . Lancets (ONETOUCH ULTRASOFT) lancets Use to check blood sugar once daily and as needed. Dx: E11.9 100 each 3  . Multiple Vitamin (MULTIVITAMIN) tablet Take 1 tablet by mouth daily.      . Red Yeast Rice 600 MG CAPS Take 1 capsule by mouth daily.      No current facility-administered medications for this visit.       Physical Exam:   BP 120/68 (BP Location: Right Arm, Patient  Position: Sitting, Cuff Size: Small)   Pulse 60   Resp 20   Ht 5' 2.25" (1.581 m)   Wt 180 lb (81.6 kg)   BMI 32.66 kg/m   General:  Well-appearing  Chest:   Clear to auscultation  CV:   Regular rate and rhythm with soft systolic murmur  Incisions:  n/a  Abdomen:  Soft and nontender  Extremities:  Warm and well-perfused  Diagnostic Tests:  CT ANGIOGRAPHY CHEST WITH CONTRAST  TECHNIQUE: Multidetector CT imaging of the chest was performed using the standard protocol during bolus administration of intravenous contrast. Multiplanar CT image reconstructions and MIPs were obtained to evaluate the vascular anatomy.  CONTRAST:  75 mL Isovue 370  COMPARISON:  09/24/2014  FINDINGS: Cardiovascular: Stable cardiomegaly. Ascending thoracic aorta aneurysm measuring 4.0 cm in maximum diameter remains stable. No evidence of thoracic aortic dissection.  Mediastinum/Lymph Nodes: No masses or pathologically enlarged lymph nodes identified.  Lungs/Pleura: No pulmonary mass, infiltrate, or effusion.  Upper abdomen: No acute findings.  Musculoskeletal: No chest wall mass or suspicious bone lesions identified.  Review of the MIP images confirms the above findings.  IMPRESSION: Stable 4.0 cm ascending thoracic aortic aneurysm. Recommend annual imaging followup by CTA or  MRA. This recommendation follows 2010 ACCF/AHA/AATS/ACR/ASA/SCA/SCAI/SIR/STS/SVM Guidelines for the Diagnosis and Management of Patients with Thoracic Aortic Disease. Circulation. 2010; 121: Z601-U932.  Stable cardiomegaly.   Electronically Signed   By: Earle Gell M.D.   On: 10/21/2015 15:24   Impression:  I have personally reviewed the patient's follow-up CT angiogram performed earlier today.  She has stable mild aneurysmal enlargement of the aortic root and ascending thoracic aorta.  There is clearly some motion artifact in the scan performed today, but overall the size of the aorta appears no  different in comparison with the last 2 previous scans.   Plan:  The patient will return in 1 year for follow-up CT angiogram.  I have again emphasized how important it is that her blood pressure remain under good control. The potential benefits of regular exercise and diet have been advised.  I spent in excess of 15 minutes during the conduct of this office consultation and >50% of this time involved direct face-to-face encounter with the patient for counseling and/or coordination of their care.    Valentina Gu. Roxy Manns, MD 10/21/2015 4:08 PM

## 2015-10-21 NOTE — Patient Instructions (Signed)
Make every effort to stay physically active, get some type of exercise on a regular basis, and stick to a "heart healthy diet".  The long term benefits for regular exercise and a healthy diet are critically important to your overall health and wellbeing.  Continue to monitor your blood pressure and notify your primary care physician if it remains elevated

## 2015-10-21 NOTE — Telephone Encounter (Signed)
Pt left v/m requesting refill amoxicillin for heart murmur for dental appt on 11/07/[facility] [address]

## 2015-10-22 NOTE — Telephone Encounter (Addendum)
Karla Ortega notified by telephone that Dr. Ermalene SearingBedsole reviewed  her ECHO.Marland Kitchen. No issue that needs antibiotic prophylaxis. We used to do this more in past, but recommendations NOW, given risk on unecessary antibiotics and no clear benefit.. is not to use antibiotics prior to dental procedure.

## 2015-10-22 NOTE — Telephone Encounter (Signed)
Notify pt I reviewed ECHO.Marland Kitchen. No issue that needs antibiotic prophylaxis. We used to do this more in past, but recommendations  NOW given risk on unecessary antibiotics and no clear benefit.. Not to use antibiotics prior to dental procedure.

## 2015-11-05 ENCOUNTER — Telehealth: Payer: Self-pay

## 2015-11-05 MED ORDER — GLUCOSE BLOOD VI STRP: ORAL_STRIP | 3 refills | Status: DC

## 2015-11-05 NOTE — Telephone Encounter (Signed)
BP went up because we removed the lisinopril from her combo med and change to HCTZ alone at a higher dose given she had swelling.  Has swelling improved now?  If so we can have her return to her previous dose of lisinopril/HCTZ. If not let me know.

## 2015-11-05 NOTE — Telephone Encounter (Signed)
Pt left v/m; pt tried HCTZ for 2 weeks and BP went up and pt had dry mouth. Pt request to change to a different BP med. Pt request cb.[facility] [address]. Pt seen 10/15/15.

## 2015-11-05 NOTE — Telephone Encounter (Signed)
Left message for Jasmine DecemberSharon to return my call.  I also need to verify if she requested test strips [facility].

## 2015-11-05 NOTE — Telephone Encounter (Signed)
[facility] left v/m requesting refill test strips for pt fax # [phone]. Pt left v/m OKing to send test strips to [facility]. Refill done as requested.

## 2015-11-06 NOTE — Telephone Encounter (Signed)
Patient returned Donna's call.  Please call patient back at [phone].

## 2015-11-06 NOTE — Telephone Encounter (Signed)
Jasmine DecemberSharon notified as instructed by telephone.  She states the swelling is better but she hasn't really been walking for a long period of time either.  She went ahead and restarted her lisinopril/hctz on Monday and her blood pressures have been better.  She did request test strips for [facility].  Order faxed to [phone].

## 2015-11-06 NOTE — Telephone Encounter (Signed)
Left message for Karla Ortega to return my call

## 2015-11-07 ENCOUNTER — Telehealth: Payer: Self-pay

## 2015-11-07 NOTE — Telephone Encounter (Signed)
Error - duplicate

## 2015-11-07 NOTE — Telephone Encounter (Signed)
Pt left v/m requesting a note that pt does not need premedication due to heart murmur faxed to dentist officer [phone].last annual 10/15/15.Please advise.pt request cb.

## 2015-11-08 ENCOUNTER — Encounter: Payer: Self-pay | Admitting: *Deleted

## 2015-11-08 NOTE — Telephone Encounter (Signed)
Letter written and faxed to Ms. Vanderlinden-Jones dentist at [phone] as requested.

## 2015-11-08 NOTE — Telephone Encounter (Signed)
Okay to write note stating this.

## 2015-11-14 ENCOUNTER — Telehealth: Payer: Self-pay

## 2015-11-14 NOTE — Telephone Encounter (Signed)
Karla Ortega with [facility] left v/m that a PA for onetouch test strips [facility] and request status of PA.

## 2015-11-14 NOTE — Telephone Encounter (Signed)
Left message for Karla Ortega with [facility] that we do not do PA on glucose test strips.  Patient will need to call her insurance to see what brand of glucose meter and test strips they will cover.

## 2015-11-18 NOTE — Telephone Encounter (Signed)
Received [facility] from [facility] requesting PA for Accu-Chek SmartView Strips. PA completed and faxed back to [facility] at [phone].

## 2015-11-19 NOTE — Telephone Encounter (Signed)
PA denied.  Formulary Alternatives: Onetouch Ultra Strips/Kit.  Onetouch Verio Strips/Kit.  Look like a Rx for One Touch Ultra Kit and test strips [facility] 04/02/2015.  Rena sent in Rx for Onetouch Verio test strips to [facility] on 11/05/2015.

## 2015-12-27 ENCOUNTER — Other Ambulatory Visit: Payer: Self-pay | Admitting: Family Medicine

## 2015-12-27 NOTE — Addendum Note (Signed)
Addended by: Damita LackLORING, DONNA S on: 12/27/2015 12:42 PM   Modules accepted: Orders

## 2015-12-31 ENCOUNTER — Other Ambulatory Visit: Payer: Self-pay | Admitting: Family Medicine

## 2016-02-27 ENCOUNTER — Other Ambulatory Visit: Payer: Self-pay | Admitting: Family Medicine

## 2016-04-06 ENCOUNTER — Telehealth: Payer: Self-pay | Admitting: Family Medicine

## 2016-04-06 NOTE — Telephone Encounter (Signed)
-----   Message from Alvina Chouerri J Walsh sent at 04/06/2016 10:29 AM EST ----- Regarding: Lab orders for Tuesday, 2.13.18  Lab orders for a 2 week f/u

## 2016-04-06 NOTE — Telephone Encounter (Signed)
Bedsole pt  

## 2016-04-07 ENCOUNTER — Telehealth: Payer: Self-pay | Admitting: Family Medicine

## 2016-04-07 DIAGNOSIS — E7800 Pure hypercholesterolemia, unspecified: Secondary | ICD-10-CM

## 2016-04-07 DIAGNOSIS — E119 Type 2 diabetes mellitus without complications: Secondary | ICD-10-CM

## 2016-04-07 NOTE — Telephone Encounter (Signed)
-----   Message from Alvina Chouerri J Walsh sent at 04/07/2016  8:01 AM EST ----- Regarding: Lab orders for today Lab orders for a 2 week f/u

## 2016-04-14 ENCOUNTER — Other Ambulatory Visit: Payer: BC Managed Care – PPO

## 2016-04-21 ENCOUNTER — Ambulatory Visit: Payer: BC Managed Care – PPO | Admitting: Family Medicine

## 2016-07-21 ENCOUNTER — Telehealth: Payer: Self-pay

## 2016-07-21 NOTE — Telephone Encounter (Signed)
Pt left v/m requesting cb (that was entire message). Left v/m requesting cb. 

## 2016-07-21 NOTE — Telephone Encounter (Signed)
Patient states she is returning call from Lupita LeashDonna, explained that I did not see a phone note.  Insisted she was returning CMA Donna's call.  Best number  [phone]

## 2016-07-23 MED ORDER — LISINOPRIL-HYDROCHLOROTHIAZIDE 20-12.5 MG PO TABS: 1.0000 | ORAL_TABLET | Freq: Every day | ORAL | 0 refills | Status: DC

## 2016-07-23 NOTE — Telephone Encounter (Signed)
Pt requesting refill lisinopril HCTZ to [facility] [address], last annual 10/15/15; refilled x 1 until next appt. Pt is moving to [place] and [facility].

## 2016-09-03 ENCOUNTER — Other Ambulatory Visit: Payer: Self-pay | Admitting: Thoracic Surgery (Cardiothoracic Vascular Surgery)

## 2016-09-03 DIAGNOSIS — I712 Thoracic aortic aneurysm, without rupture, unspecified: Secondary | ICD-10-CM

## 2016-10-12 ENCOUNTER — Encounter: Payer: Self-pay | Admitting: Thoracic Surgery (Cardiothoracic Vascular Surgery)

## 2016-10-12 ENCOUNTER — Ambulatory Visit (INDEPENDENT_AMBULATORY_CARE_PROVIDER_SITE_OTHER): Payer: Medicare Other | Admitting: Thoracic Surgery (Cardiothoracic Vascular Surgery)

## 2016-10-12 ENCOUNTER — Encounter: Payer: Self-pay | Admitting: Family Medicine

## 2016-10-12 ENCOUNTER — Ambulatory Visit
Admission: RE | Admit: 2016-10-12 | Discharge: 2016-10-12 | Disposition: A | Discharge status: Home / Self Care | Payer: Medicare Other | Source: Ambulatory Visit | Attending: Thoracic Surgery (Cardiothoracic Vascular Surgery) | Admitting: Thoracic Surgery (Cardiothoracic Vascular Surgery)

## 2016-10-12 VITALS — BP 148/70 | HR 49 | Resp 16 | Ht 62.25 in | Wt 176.0 lb

## 2016-10-12 DIAGNOSIS — I712 Thoracic aortic aneurysm, without rupture, unspecified: Secondary | ICD-10-CM

## 2016-10-12 DIAGNOSIS — I7121 Aneurysm of the ascending aorta, without rupture: Secondary | ICD-10-CM

## 2016-10-12 MED ORDER — IOPAMIDOL (ISOVUE-370) INJECTION 76%
75.0000 mL | Freq: Once | INTRAVENOUS | Status: AC | PRN
  Administered 2016-10-12: 75 mL via INTRAVENOUS

## 2016-10-12 NOTE — Progress Notes (Addendum)
[address]       [place] [address]             [phone]     CARDIOTHORACIC SURGERY OFFICE NOTE  Referring Provider is Excell Seltzer, MD PCP is Excell Seltzer, MD   HPI:  Patient is a 66 year old moderately obese African-American female with history of hypertension and type 2 diabetes mellitus who returns to the office today for routine follow-up and surveillance of mild aneurysmal enlargement of the ascending thoracic aorta. She was last seen here in our office on 10/21/2015. Since then she has done well and remained clinically stable. She reports no new problems or complaints. She states that her blood pressure has remained under good control at home. She denies any symptoms of chest pain or back pain which could be related to her thoracic aorta.   Current Outpatient Prescriptions  Medication Sig Dispense Refill  . aspirin 81 MG tablet Take 81 mg by mouth daily.      Marland Kitchen CINNAMON PO Take 1 tablet by mouth daily.     . Fish Oil OIL Take one capsule by mouth once a day     . Garlic (ODOR FREE GARLIC) 100 MG TABS Take one by mouth daily     . Lancets (ONETOUCH ULTRASOFT) lancets Use to check blood sugar once daily and as needed. Dx: E11.9 100 each 3  . lisinopril-hydrochlorothiazide (PRINZIDE,ZESTORETIC) 20-12.5 MG tablet Take 1 tablet by mouth daily. 90 tablet 0  . Multiple Vitamin (MULTIVITAMIN) tablet Take 1 tablet by mouth daily.      Letta Pate VERIO test strip USE TO CHECK BLOOD SUGAR DAILY 100 each 3  . Red Yeast Rice 600 MG CAPS Take 1 capsule by mouth daily.      No current facility-administered medications for this visit.       Physical Exam:   BP (!) 148/70 (BP Location: Right Arm, Patient Position: Sitting, Cuff Size: Normal)   Pulse (!) 49   Resp 16   Ht 5' 2.25" (1.581 m)   Wt 176 lb (79.8 kg)   SpO2 98% Comment: ON RA  BMI 31.93 kg/m   General:  Obese but well appearing  Chest:   Clear to auscultation  CV:   Regular rate and  rhythm without murmur  Incisions:  n/a  Abdomen:  Soft and nontender  Extremities:  Warm and well-perfused  Diagnostic Tests:  I have personally reviewed the CT angiogram of the chest performed earlier today. The scan has not yet been formally read by a radiologist. Size and other associated characteristics of the mildly enlarged ascending thoracic aorta appear unchanged in comparison with the scan performed one year previously.   Impression:  Stable mild fusiform aneurysmal enlargement of the ascending thoracic aorta  Plan:  We have not recommended any changes to the patient's current medications. The patient will return in approximately one year for follow-up radiographic imaging. If her aorta remains stable at that time we may consider decreasing the frequency of surveillance. The patient has been reminded that she should continue to monitor her blood pressure on a regular basis and seek attention from her primary care physician if medication adjustments are necessary.  I spent in excess of 15 minutes during the conduct of this office consultation and >50% of this time involved direct face-to-face encounter with the patient for counseling and/or coordination of their care.   Salvatore Decent. Cornelius Moras, MD 10/12/2016 1:26 PM  Addendum:  CT ANGIOGRAPHY CHEST WITH CONTRAST  TECHNIQUE: Multidetector CT imaging of the chest was performed using the standard protocol during bolus administration of intravenous contrast. Multiplanar CT image reconstructions and MIPs were obtained to evaluate the vascular anatomy.  CONTRAST:  75 mL Isovue 370 IV  Creatinine was obtained on site at [facility] at [address]  Results: Creatinine 0.7 mg/dL.  BUN 13.  GFR 105.  COMPARISON:  10/20/2025 and  previous  FINDINGS: Cardiovascular: Thoracic aortic aneurysm with maximum diameter measurements as follows is:  3 cm sinuses of Valsalva  2.3 cm sino-tubular junction  4  cm mid ascending (previously 4 cm)  3.5 cm distal ascending/ proximal arch  2.1 cm distal arch  2.3 cm proximal descending  2 cm distal descending  Negative for dissection or stenosis. Classic 3 vessel brachiocephalic arterial origin anatomy without proximal stenosis. No significant atheromatous irregularity. Visualized supra celiac abdominal aorta unremarkable.  Mild four-chamber cardiac enlargement. No pericardial effusion. Central pulmonary artery branches unremarkable; the exam was not optimized for detection of pulmonary emboli.  Mediastinum/Nodes: No enlarged mediastinal, hilar, or axillary lymph nodes. Thyroid gland, trachea, and esophagus demonstrate no significant findings.  Lungs/Pleura: Lungs are clear. No pleural effusion or pneumothorax.  Upper Abdomen: No acute abnormality.  Musculoskeletal: Minimal endplate spurring in the lower cervical and midthoracic spine. Old sternal fracture deformity. Negative for acute fracture or worrisome bone lesion.  Review of the MIP images confirms the above findings.  IMPRESSION: 1. Stable 4 cm ascending thoracic aortic aneurysm without complicating features.  Recommend annual imaging followup by CTA or MRA. This recommendation follows 2010 ACCF/AHA/AATS/ACR/ASA/SCA/SCAI/SIR/STS/SVM Guidelines for the Diagnosis and Management of Patients with Thoracic Aortic Disease. Circulation. 2010; 121: Z610-R604   Electronically Signed   By: Corlis Leak M.D.   On: 10/12/2016 13:02

## 2016-10-12 NOTE — Patient Instructions (Signed)
Continue all previous medications without any changes at this time  Monitor your blood pressure regularly and discuss adjustments to your medications with your primary care physician

## 2016-10-18 ENCOUNTER — Other Ambulatory Visit: Payer: Self-pay | Admitting: Family Medicine

## 2016-11-09 ENCOUNTER — Telehealth: Payer: Self-pay

## 2016-11-09 NOTE — Telephone Encounter (Addendum)
Pt left v/m requesting records sent to Dr Renford Dills; fax # [phone]. I called pt back but pt was not accepting calls per recording. Pt will need to come to office to sign record release or sign one at the office records are to go to. Pt said Dr Nehemiah Settle office gave her form to mail to [facility]. Nothing further needed.

## 2016-12-17 ENCOUNTER — Other Ambulatory Visit: Payer: Self-pay | Admitting: Family Medicine

## 2016-12-26 ENCOUNTER — Other Ambulatory Visit: Payer: Self-pay | Admitting: Family Medicine

## 2017-01-09 ENCOUNTER — Other Ambulatory Visit: Payer: Self-pay | Admitting: Family Medicine

## 2017-09-03 ENCOUNTER — Other Ambulatory Visit: Payer: Self-pay | Admitting: Thoracic Surgery (Cardiothoracic Vascular Surgery)

## 2017-09-03 DIAGNOSIS — I712 Thoracic aortic aneurysm, without rupture, unspecified: Secondary | ICD-10-CM

## 2017-09-09 ENCOUNTER — Telehealth: Payer: Self-pay

## 2017-09-09 NOTE — Telephone Encounter (Signed)
Left VM on patient's phone regarding cancelled appointment and call back if needed.

## 2017-09-09 NOTE — Telephone Encounter (Signed)
-----   Message from Purcell Nailslarence H Owen, MD sent at 09/08/2017  5:36 PM EDT ----- 2 years is fine  ----- Message ----- From: Steve RattlerBurgess, Burnett Spray F, RN Sent: 09/08/2017  10:51 AM To: Purcell Nailslarence H Owen, MD  Patient states that she was told by you that she would have a follow-up every 2 years from now on for her aneurysm.    I told her according to your last note/visit that you wanted to see her back in 1 year with Chest CTA.  She wasn't accepting that.  Please advise.  Morrie SheldonAshley

## 2017-10-18 ENCOUNTER — Other Ambulatory Visit: Payer: Medicare Other

## 2017-10-18 ENCOUNTER — Ambulatory Visit: Payer: Medicare Other | Admitting: Thoracic Surgery (Cardiothoracic Vascular Surgery)

## 2018-06-17 DIAGNOSIS — E2839 Other primary ovarian failure: Secondary | ICD-10-CM | POA: Diagnosis not present

## 2018-06-17 DIAGNOSIS — E1169 Type 2 diabetes mellitus with other specified complication: Secondary | ICD-10-CM | POA: Diagnosis not present

## 2018-06-17 DIAGNOSIS — M8588 Other specified disorders of bone density and structure, other site: Secondary | ICD-10-CM | POA: Diagnosis not present

## 2018-06-17 DIAGNOSIS — I10 Essential (primary) hypertension: Secondary | ICD-10-CM | POA: Diagnosis not present

## 2018-07-27 DIAGNOSIS — M810 Age-related osteoporosis without current pathological fracture: Secondary | ICD-10-CM | POA: Diagnosis not present

## 2018-07-27 DIAGNOSIS — I10 Essential (primary) hypertension: Secondary | ICD-10-CM | POA: Diagnosis not present

## 2018-08-29 ENCOUNTER — Other Ambulatory Visit: Payer: Self-pay | Admitting: Surgery

## 2018-08-29 DIAGNOSIS — I712 Thoracic aortic aneurysm, without rupture, unspecified: Secondary | ICD-10-CM

## 2018-10-17 ENCOUNTER — Encounter: Payer: Self-pay | Admitting: Thoracic Surgery (Cardiothoracic Vascular Surgery)

## 2018-10-17 ENCOUNTER — Ambulatory Visit (INDEPENDENT_AMBULATORY_CARE_PROVIDER_SITE_OTHER): Payer: Medicare Other | Admitting: Thoracic Surgery (Cardiothoracic Vascular Surgery)

## 2018-10-17 ENCOUNTER — Other Ambulatory Visit: Payer: Self-pay

## 2018-10-17 ENCOUNTER — Ambulatory Visit
Admission: RE | Admit: 2018-10-17 | Discharge: 2018-10-17 | Disposition: A | Discharge status: Home / Self Care | Payer: Medicare Other | Source: Ambulatory Visit | Attending: Thoracic Surgery (Cardiothoracic Vascular Surgery) | Admitting: Thoracic Surgery (Cardiothoracic Vascular Surgery)

## 2018-10-17 VITALS — BP 136/80 | HR 78 | Temp 98.1°F | Resp 16 | Ht 60.25 in | Wt 183.6 lb

## 2018-10-17 DIAGNOSIS — I712 Thoracic aortic aneurysm, without rupture, unspecified: Secondary | ICD-10-CM

## 2018-10-17 DIAGNOSIS — Z1231 Encounter for screening mammogram for malignant neoplasm of breast: Secondary | ICD-10-CM | POA: Diagnosis not present

## 2018-10-17 DIAGNOSIS — I7121 Aneurysm of the ascending aorta, without rupture: Secondary | ICD-10-CM

## 2018-10-17 MED ORDER — IOPAMIDOL (ISOVUE-370) INJECTION 76%
75.0000 mL | Freq: Once | INTRAVENOUS | Status: AC | PRN
  Administered 2018-10-17: 75 mL via INTRAVENOUS

## 2018-10-17 NOTE — Progress Notes (Signed)
[address]       [place] [id number]             [phone]     CARDIOTHORACIC SURGERY OFFICE NOTE  Referring Provider is Excell SeltzerBedsole, Amy E, MD PCP is Renford DillsPolite, Ronald, MD   HPI:  Patient is a 68 year old moderately obese African-American female with history of hypertension and type 2 diabetes mellitus who returns to the office today for routine follow-up and surveillance of mild aneurysmal enlargement of the ascending thoracic aorta. She was last seen here in our office on 10/12/2016.  She reports that over the past 2 years she has done very well.  She has remained reasonably active physically and otherwise healthy.  She states that her blood pressure has been under fairly good control at home but it does tend to go up when she goes to doctors offices.  She has not been hospitalized for any reason.  She denies any symptoms of chest pain or back pain.  She has no shortness of breath.   Current Outpatient Medications  Medication Sig Dispense Refill  . aspirin 81 MG tablet Take 81 mg by mouth daily.      Marland Kitchen. CINNAMON PO Take 1 tablet by mouth daily.     . Fish Oil OIL Take one capsule by mouth once a day     . Garlic (ODOR FREE GARLIC) 100 MG TABS Take one by mouth daily     . Lancets (ONETOUCH ULTRASOFT) lancets Use to check blood sugar once daily and as needed. Dx: E11.9 100 each 3  . lisinopril-hydrochlorothiazide (PRINZIDE,ZESTORETIC) 20-12.5 MG tablet TAKE 1 TABLET BY MOUTH EVERY DAY (NEEDS OFFICE VISIT) 15 tablet 0  . Multiple Vitamin (MULTIVITAMIN) tablet Take 1 tablet by mouth daily.      Letta Pate. ONETOUCH VERIO test strip USE TO CHECK BLOOD SUGAR DAILY 100 each 3  . Red Yeast Rice 600 MG CAPS Take 1 capsule by mouth daily.      No current facility-administered medications for this visit.       Physical Exam:   BP 136/80 (BP Location: Left Arm, Patient Position: Sitting, Cuff Size: Normal)   Pulse 78   Temp 98.1 F (36.7 C)   Resp 16   Ht 5' 0.25" (1.53 m)    Wt 183 lb 9.6 oz (83.3 kg)   SpO2 95% Comment: RA  BMI 35.56 kg/m   General:  Well-appearing  Chest:   Clear to auscultation  CV:   Regular rate and rhythm without murmur  Incisions:  n/a  Abdomen:  Soft nontender  Extremities:  Warm and well-perfused  Diagnostic Tests:  CT ANGIOGRAPHY CHEST WITH CONTRAST  TECHNIQUE: Multidetector CT imaging of the chest was performed using the standard protocol during bolus administration of intravenous contrast. Multiplanar CT image reconstructions and MIPs were obtained to evaluate the vascular anatomy.  CONTRAST:  75mL ISOVUE-370 IOPAMIDOL (ISOVUE-370) INJECTION 76%  COMPARISON:  October 12, 2016  FINDINGS: Cardiovascular: Ascending thoracic aortic diameter measures 4.0 x 4.0 cm, stable. The measured transverse diameter at the sinus of Valsalva measures 3.0 cm, stable. The measured diameter at the sinotubular junction measures 2.5 cm. The measured transverse diameter in the aortic arch measures 3.2 cm. The measured descending thoracic aortic diameter at the level of the main pulmonary outflow tract is 2.3 x 2.3 cm. There is no evident thoracic aortic dissection. Visualized great vessels appear unremarkable. There is no pericardial effusion or pericardial thickening. There is no demonstrable  pulmonary embolus.  Mediastinum/Nodes: Thyroid is inhomogeneous in appearance without well-defined mass. No evident thoracic adenopathy. There is a small hiatal hernia.  Lungs/Pleura: There is slight bibasilar atelectasis. There is no parenchymal lung edema or consolidation. No pleural effusion evident.  Upper Abdomen: Visualized upper abdominal structures appear unremarkable.  Musculoskeletal: There are foci of degenerative change in the thoracic spine. Old fracture of the proximal sternum noted. No blastic or lytic bone lesions. No chest wall lesions.  Review of the MIP images confirms the above findings.  IMPRESSION: 1.  Stable ascending thoracic aortic prominence measuring 4.0 x 4.0 cm. No dissection evident. Recommend annual imaging followup by CTA or MRA. This recommendation follows 2010 ACCF/AHA/AATS/ACR/ASA/SCA/SCAI/SIR/STS/SVM Guidelines for the Diagnosis and Management of Patients with Thoracic Aortic Disease. Circulation. 2010; 121: A416-S063. Aortic aneurysm NOS (ICD10-I71.9).  2.  No demonstrable pulmonary embolus.  3.  No edema or consolidation.  No pleural effusion.  4.  No evident thoracic adenopathy.  5.  Small hiatal hernia.   Electronically Signed   By: Lowella Grip III M.D.   On: 10/17/2018 13:10    Impression:  I have personally reviewed the patient's follow-up CT angiogram which reveals stable mild fusiform dilatation of the ascending thoracic aorta.  The patient is likely at relatively low risk for further progression   Plan:  The patient will return in 2 years for follow-up CT angiogram.  I spent in excess of 10 minutes during the conduct of this office consultation and >50% of this time involved direct face-to-face encounter with the patient for counseling and/or coordination of their care.    Valentina Gu. Roxy Manns, MD 10/17/2018 2:55 PM

## 2018-10-23 ENCOUNTER — Encounter: Payer: Self-pay | Admitting: Thoracic Surgery (Cardiothoracic Vascular Surgery)

## 2018-11-01 DIAGNOSIS — Z23 Encounter for immunization: Secondary | ICD-10-CM | POA: Diagnosis not present

## 2019-01-03 DIAGNOSIS — M8588 Other specified disorders of bone density and structure, other site: Secondary | ICD-10-CM | POA: Diagnosis not present

## 2019-01-03 DIAGNOSIS — E7800 Pure hypercholesterolemia, unspecified: Secondary | ICD-10-CM | POA: Diagnosis not present

## 2019-01-03 DIAGNOSIS — I10 Essential (primary) hypertension: Secondary | ICD-10-CM | POA: Diagnosis not present

## 2019-01-03 DIAGNOSIS — E1169 Type 2 diabetes mellitus with other specified complication: Secondary | ICD-10-CM | POA: Diagnosis not present

## 2019-01-03 DIAGNOSIS — E663 Overweight: Secondary | ICD-10-CM | POA: Diagnosis not present

## 2019-04-10 ENCOUNTER — Ambulatory Visit: Payer: Medicare Other | Attending: Internal Medicine

## 2019-04-14 ENCOUNTER — Ambulatory Visit: Payer: Medicare Other

## 2019-05-04 ENCOUNTER — Encounter: Payer: Self-pay | Admitting: Thoracic Surgery (Cardiothoracic Vascular Surgery)

## 2019-05-11 ENCOUNTER — Other Ambulatory Visit: Payer: Self-pay

## 2019-06-15 DIAGNOSIS — E2839 Other primary ovarian failure: Secondary | ICD-10-CM | POA: Diagnosis not present

## 2019-06-15 DIAGNOSIS — Z1389 Encounter for screening for other disorder: Secondary | ICD-10-CM | POA: Diagnosis not present

## 2019-06-15 DIAGNOSIS — E1169 Type 2 diabetes mellitus with other specified complication: Secondary | ICD-10-CM | POA: Diagnosis not present

## 2019-06-15 DIAGNOSIS — Z0000 Encounter for general adult medical examination without abnormal findings: Secondary | ICD-10-CM | POA: Diagnosis not present

## 2019-06-15 DIAGNOSIS — E7800 Pure hypercholesterolemia, unspecified: Secondary | ICD-10-CM | POA: Diagnosis not present

## 2019-06-15 DIAGNOSIS — E119 Type 2 diabetes mellitus without complications: Secondary | ICD-10-CM | POA: Diagnosis not present

## 2019-06-15 DIAGNOSIS — Z1231 Encounter for screening mammogram for malignant neoplasm of breast: Secondary | ICD-10-CM | POA: Diagnosis not present

## 2019-06-15 DIAGNOSIS — I10 Essential (primary) hypertension: Secondary | ICD-10-CM | POA: Diagnosis not present

## 2019-06-15 DIAGNOSIS — E663 Overweight: Secondary | ICD-10-CM | POA: Diagnosis not present

## 2019-10-19 DIAGNOSIS — Z1231 Encounter for screening mammogram for malignant neoplasm of breast: Secondary | ICD-10-CM | POA: Diagnosis not present

## 2019-11-10 DIAGNOSIS — Z23 Encounter for immunization: Secondary | ICD-10-CM | POA: Diagnosis not present

## 2019-12-13 DIAGNOSIS — Z23 Encounter for immunization: Secondary | ICD-10-CM | POA: Diagnosis not present

## 2019-12-25 DIAGNOSIS — M8588 Other specified disorders of bone density and structure, other site: Secondary | ICD-10-CM | POA: Diagnosis not present

## 2019-12-25 DIAGNOSIS — I10 Essential (primary) hypertension: Secondary | ICD-10-CM | POA: Diagnosis not present

## 2019-12-25 DIAGNOSIS — E1169 Type 2 diabetes mellitus with other specified complication: Secondary | ICD-10-CM | POA: Diagnosis not present

## 2019-12-25 DIAGNOSIS — E7800 Pure hypercholesterolemia, unspecified: Secondary | ICD-10-CM | POA: Diagnosis not present

## 2020-01-05 ENCOUNTER — Other Ambulatory Visit: Payer: Self-pay | Admitting: Internal Medicine

## 2020-01-05 DIAGNOSIS — M8588 Other specified disorders of bone density and structure, other site: Secondary | ICD-10-CM

## 2020-01-10 DIAGNOSIS — M8589 Other specified disorders of bone density and structure, multiple sites: Secondary | ICD-10-CM | POA: Diagnosis not present

## 2020-01-14 DIAGNOSIS — Z23 Encounter for immunization: Secondary | ICD-10-CM | POA: Diagnosis not present

## 2020-02-16 DIAGNOSIS — Z20822 Contact with and (suspected) exposure to covid-19: Secondary | ICD-10-CM | POA: Diagnosis not present

## 2020-05-04 DIAGNOSIS — Z20822 Contact with and (suspected) exposure to covid-19: Secondary | ICD-10-CM | POA: Diagnosis not present

## 2020-05-08 DIAGNOSIS — Z20822 Contact with and (suspected) exposure to covid-19: Secondary | ICD-10-CM | POA: Diagnosis not present

## 2020-05-26 DIAGNOSIS — Z23 Encounter for immunization: Secondary | ICD-10-CM | POA: Diagnosis not present

## 2020-09-03 ENCOUNTER — Other Ambulatory Visit: Payer: Self-pay | Admitting: Surgery

## 2020-09-03 DIAGNOSIS — I712 Thoracic aortic aneurysm, without rupture, unspecified: Secondary | ICD-10-CM

## 2020-09-03 DIAGNOSIS — I7121 Aneurysm of the ascending aorta, without rupture: Secondary | ICD-10-CM

## 2020-10-15 ENCOUNTER — Ambulatory Visit
Admission: RE | Admit: 2020-10-15 | Discharge: 2020-10-15 | Disposition: A | Discharge status: Home / Self Care | Payer: Medicare Other | Source: Ambulatory Visit | Attending: Surgery | Admitting: Surgery

## 2020-10-15 DIAGNOSIS — I712 Thoracic aortic aneurysm, without rupture, unspecified: Secondary | ICD-10-CM

## 2020-10-15 DIAGNOSIS — I517 Cardiomegaly: Secondary | ICD-10-CM | POA: Diagnosis not present

## 2020-10-15 DIAGNOSIS — Z1231 Encounter for screening mammogram for malignant neoplasm of breast: Secondary | ICD-10-CM | POA: Diagnosis not present

## 2020-10-15 MED ORDER — IOPAMIDOL (ISOVUE-370) INJECTION 76%
75.0000 mL | Freq: Once | INTRAVENOUS | Status: AC | PRN
  Administered 2020-10-15: 75 mL via INTRAVENOUS

## 2020-10-16 ENCOUNTER — Telehealth: Payer: Medicare Other | Admitting: Surgery

## 2020-10-16 ENCOUNTER — Other Ambulatory Visit: Payer: Medicare Other

## 2020-10-17 ENCOUNTER — Telehealth: Payer: Medicare Other

## 2020-10-17 ENCOUNTER — Telehealth (INDEPENDENT_AMBULATORY_CARE_PROVIDER_SITE_OTHER): Payer: Medicare Other | Admitting: Physician Assistant

## 2020-10-17 ENCOUNTER — Other Ambulatory Visit: Payer: Self-pay

## 2020-10-17 DIAGNOSIS — I712 Thoracic aortic aneurysm, without rupture: Secondary | ICD-10-CM | POA: Diagnosis not present

## 2020-10-17 NOTE — Progress Notes (Addendum)
[address]       [place] [address]             [phone]        Referring Provider is Excell Seltzer, MD PCP is Renford Dills, MD   Patient's name and date of birth verified during our phone call today.   Patient: Home  Provider: Office      HPI:   Ms. Ton and I spoke over the phone today. She has been doing well and maintaining her activity level with her wallking several times a week. She also has been eating well. Her BP at home runs usually 120s systolic. She has a PCP that checks it for her as well. She has not had any chest pain or shortness of breath.      Current Outpatient Medications on File Prior to Visit  Medication Sig Dispense Refill   aspirin 81 MG tablet Take 81 mg by mouth daily.       CINNAMON PO Take 1 tablet by mouth daily.      Fish Oil OIL Take one capsule by mouth once a day      Garlic (ODOR FREE GARLIC) 100 MG TABS Take one by mouth daily      Lancets (ONETOUCH ULTRASOFT) lancets Use to check blood sugar once daily and as needed. Dx: E11.9 100 each 3   lisinopril-hydrochlorothiazide (PRINZIDE,ZESTORETIC) 20-12.5 MG tablet TAKE 1 TABLET BY MOUTH EVERY DAY (NEEDS OFFICE VISIT) 15 tablet 0   Multiple Vitamin (MULTIVITAMIN) tablet Take 1 tablet by mouth daily.       ONETOUCH VERIO test strip USE TO CHECK BLOOD SUGAR DAILY 100 each 3   Red Yeast Rice 600 MG CAPS Take 1 capsule by mouth daily.      No current facility-administered medications on file prior to visit.                   Physical Exam:  Virtual visit. 119-120 systolic per patient report   Diagnostic Tests:    CLINICAL DATA:  Thoracic aortic aneurysm follow-up in a 70 year old female.   Creatinine was obtained on site at [facility] at [address]   Results: Creatinine 0.8 mg/dL.   EXAM: CT ANGIOGRAPHY CHEST WITH CONTRAST   TECHNIQUE: Multidetector CT imaging of the chest was performed using the standard protocol during bolus  administration of intravenous contrast. Multiplanar CT image reconstructions and MIPs were obtained to evaluate the vascular anatomy.   CONTRAST:  76mL ISOVUE-370 IOPAMIDOL (ISOVUE-370) INJECTION 76%   COMPARISON:  Comparison made with October 17, 2018.   FINDINGS: Cardiovascular: Ascending thoracic aorta as measured previously approximately 4 x 4 cm in the axial plane.   Descending thoracic aorta approximately 2 cm transverse dimension.   Great vessels are patent with some pulsation artifact at the level of the aortic arch.   Coronal plane measurements:   Approximately 3.8 cm of the ascending thoracic aorta.   At the level of the sinuses aorta measures 2.7 cm and the aortic root measuring approximately 2.5 cm.   No acute process in the chest related to cardiovascular structures. Heart size mildly enlarged and unchanged. No pericardial effusion.   Central pulmonary vessels are grossly normal.  Normal caliber.   Mediastinum/Nodes: Mildly heterogeneous thyroid without enlargement is stable. No adenopathy in the chest. Mildly patulous esophagus.   Lungs/Pleura: No effusion. No consolidative process. Airways are patent.   Upper Abdomen: No acute upper abdominal  findings. Imaged portions the liver, gallbladder, pancreas, spleen, adrenal glands, kidneys and gastrointestinal tract are unremarkable on limited assessment.   Musculoskeletal: No acute bone finding or destructive bone process.   Review of the MIP images confirms the above findings.   IMPRESSION: 1. Stable dilation of the ascending thoracic aorta to approximately 4 cm in transverse dimension, no acute findings. Recommend annual imaging followup by CTA or MRA. This recommendation follows 2010 ACCF/AHA/AATS/ACR/ASA/SCA/SCAI/SIR/STS/SVM Guidelines for the Diagnosis and Management of Patients with Thoracic Aortic Disease. Circulation. 2010; 121: R485-I627. Aortic aneurysm NOS (ICD10-I71.9) 2. Stable mild  cardiomegaly.     Electronically Signed   By: Donzetta Kohut M.D.   On: 10/15/2020 14:14     Impression:   CT scan shows stable mild fusiform dilatation of the ascending thoracic aorta.  The patient is likely at relatively low risk for further progression. She has maintained stable BP and a healthy lifestyle.      Plan:   The patient will return in 2 years for follow-up CT angiogram. Virtual appointment is appropriate for this patient and she prefers this.    I spent in excess of 10 minutes during the conduct of this office consultation and >50% of this time involved direct face-to-face encounter with the patient for counseling and/or coordination of their care.   Jari Favre, PA-C  [phone]

## 2021-02-04 DIAGNOSIS — M8580 Other specified disorders of bone density and structure, unspecified site: Secondary | ICD-10-CM | POA: Diagnosis not present

## 2021-02-04 DIAGNOSIS — E1169 Type 2 diabetes mellitus with other specified complication: Secondary | ICD-10-CM | POA: Diagnosis not present

## 2021-02-04 DIAGNOSIS — Z0000 Encounter for general adult medical examination without abnormal findings: Secondary | ICD-10-CM | POA: Diagnosis not present

## 2021-02-04 DIAGNOSIS — I10 Essential (primary) hypertension: Secondary | ICD-10-CM | POA: Diagnosis not present

## 2021-02-04 DIAGNOSIS — E7800 Pure hypercholesterolemia, unspecified: Secondary | ICD-10-CM | POA: Diagnosis not present

## 2021-02-04 DIAGNOSIS — Z5181 Encounter for therapeutic drug level monitoring: Secondary | ICD-10-CM | POA: Diagnosis not present

## 2021-04-10 DIAGNOSIS — Z01419 Encounter for gynecological examination (general) (routine) without abnormal findings: Secondary | ICD-10-CM | POA: Diagnosis not present

## 2021-06-21 DIAGNOSIS — Z20822 Contact with and (suspected) exposure to covid-19: Secondary | ICD-10-CM | POA: Diagnosis not present

## 2021-06-25 DIAGNOSIS — Z20822 Contact with and (suspected) exposure to covid-19: Secondary | ICD-10-CM | POA: Diagnosis not present

## 2021-09-11 DIAGNOSIS — S99922A Unspecified injury of left foot, initial encounter: Secondary | ICD-10-CM | POA: Diagnosis not present

## 2021-12-22 DIAGNOSIS — Z1231 Encounter for screening mammogram for malignant neoplasm of breast: Secondary | ICD-10-CM | POA: Diagnosis not present

## 2022-01-01 DIAGNOSIS — Z23 Encounter for immunization: Secondary | ICD-10-CM | POA: Diagnosis not present

## 2022-03-02 DIAGNOSIS — Z780 Asymptomatic menopausal state: Secondary | ICD-10-CM | POA: Diagnosis not present

## 2022-03-02 DIAGNOSIS — M8589 Other specified disorders of bone density and structure, multiple sites: Secondary | ICD-10-CM | POA: Diagnosis not present

## 2022-04-27 DIAGNOSIS — M8580 Other specified disorders of bone density and structure, unspecified site: Secondary | ICD-10-CM | POA: Diagnosis not present

## 2022-04-27 DIAGNOSIS — E1169 Type 2 diabetes mellitus with other specified complication: Secondary | ICD-10-CM | POA: Diagnosis not present

## 2022-04-27 DIAGNOSIS — I10 Essential (primary) hypertension: Secondary | ICD-10-CM | POA: Diagnosis not present

## 2022-04-27 DIAGNOSIS — Z5181 Encounter for therapeutic drug level monitoring: Secondary | ICD-10-CM | POA: Diagnosis not present

## 2022-04-27 DIAGNOSIS — Z0000 Encounter for general adult medical examination without abnormal findings: Secondary | ICD-10-CM | POA: Diagnosis not present

## 2022-04-27 DIAGNOSIS — E7800 Pure hypercholesterolemia, unspecified: Secondary | ICD-10-CM | POA: Diagnosis not present

## 2022-09-01 ENCOUNTER — Other Ambulatory Visit: Payer: Self-pay | Admitting: Surgery

## 2022-09-01 DIAGNOSIS — I7121 Aneurysm of the ascending aorta, without rupture: Secondary | ICD-10-CM

## 2022-10-15 NOTE — Progress Notes (Addendum)
      [address]       [place] [address]             [phone]       HPI:  This is a telephone visit for a 2 year follow up of a known Ascending Aneurysm.  Her last CT scan performed in 8/22 showed her aneurysm to be stable at 4.0 cm.  She underwent CT scan today on 8/26.  Overall patient continues to do well.  She does not have symptoms currently.  She did question what the concerning symptoms would be and what she should do if she developed these.  Current Outpatient Medications  Medication Sig Dispense Refill   aspirin 81 MG tablet Take 81 mg by mouth daily.       CINNAMON PO Take 1 tablet by mouth daily.      Fish Oil OIL Take one capsule by mouth once a day      Garlic (ODOR FREE GARLIC) 100 MG TABS Take one by mouth daily      Lancets (ONETOUCH ULTRASOFT) lancets Use to check blood sugar once daily and as needed. Dx: E11.9 100 each 3   lisinopril-hydrochlorothiazide (PRINZIDE,ZESTORETIC) 20-12.5 MG tablet TAKE 1 TABLET BY MOUTH EVERY DAY (NEEDS OFFICE VISIT) 15 tablet 0   Multiple Vitamin (MULTIVITAMIN) tablet Take 1 tablet by mouth daily.       ONETOUCH VERIO test strip USE TO CHECK BLOOD SUGAR DAILY 100 each 3   Red Yeast Rice 600 MG CAPS Take 1 capsule by mouth daily.      No current facility-administered medications for this visit.    Physical Exam  Diagnostic Tests:   A/P:  Ascending Aneurysm- stable, measuring 3.8-3.9.Marland KitchenMarland Kitchen this is normal, however this is twice the size of the descending aorta.  Will continue surveillance at 2 years interval with virtual follow up... repeat CTA chest H/O HTN- continue Lisinopril/hydrochlorothiazide  I spent 30 minutes on the phone with the patient  Lowella Dandy, PA-C [facility] [phone]

## 2022-10-19 ENCOUNTER — Ambulatory Visit
Admission: RE | Admit: 2022-10-19 | Discharge: 2022-10-19 | Disposition: A | Discharge status: Home / Self Care | Payer: Medicare Other | Source: Ambulatory Visit | Attending: Surgery | Admitting: Surgery

## 2022-10-19 ENCOUNTER — Ambulatory Visit (INDEPENDENT_AMBULATORY_CARE_PROVIDER_SITE_OTHER): Payer: Medicare Other | Admitting: Physician Assistant

## 2022-10-19 DIAGNOSIS — I7121 Aneurysm of the ascending aorta, without rupture: Secondary | ICD-10-CM | POA: Diagnosis not present

## 2022-10-19 MED ORDER — IOPAMIDOL (ISOVUE-370) INJECTION 76%
500.0000 mL | Freq: Once | INTRAVENOUS | Status: AC | PRN
  Administered 2022-10-19: 75 mL via INTRAVENOUS

## 2022-11-26 ENCOUNTER — Ambulatory Visit
Admission: EM | Admit: 2022-11-26 | Discharge: 2022-11-26 | Disposition: A | Discharge status: Home / Self Care | Payer: Medicare Other

## 2022-11-26 DIAGNOSIS — N644 Mastodynia: Secondary | ICD-10-CM

## 2022-11-26 NOTE — ED Provider Notes (Signed)
[facility]    CSN: [id number] Arrival date & time: 11/26/22  1522      History   Chief Complaint Chief Complaint  Patient presents with   Breast Pain    HPI Karla Ortega is a 72 y.o. female.   Patient here today for evaluation of pain in her left breast.  She reports that pain feels as if it is like a small pinch.  Pain is intermittent.  She denies any associated shortness of breath.  She has not any nausea or vomiting.  She has not had any lumps or redness.  She denies any tenderness to her breast.  She is up-to-date on mammogram.  She denies any fevers or unexplained weight loss.  She reports pain will only last a few minutes.  She also notes a sharp stabbing pain to the back of her left lower leg as well.  She states that this comes and goes to.  She denies any numbness.  The history is provided by the patient.    Past Medical History:  Diagnosis Date   Hypertension     Patient Active Problem List   Diagnosis Date Noted   Peripheral edema 10/15/2015   Ascending aortic aneurysm (HCC) 08/21/2013   Osteopenia 09/24/2008   HYPERCHOLESTEROLEMIA 08/28/2008   Diabetes mellitus with no complication (HCC) 11/14/2007   Essential hypertension, benign 11/14/2007   CARDIAC MURMUR, BENIGN 11/14/2007    Past Surgical History:  Procedure Laterality Date   PARTIAL HYSTERECTOMY     has left ovary.  Tubal pregnancy    OB History   No obstetric history on file.      Home Medications    Prior to Admission medications   Medication Sig Start Date End Date Taking? Authorizing Provider  Cholecalciferol 50 MCG (2000 UT) TABS Take 2,000 Units by mouth daily at 6 (six) AM.   Yes [provider]  Cinnamon 500 MG capsule Take 500 mg by mouth daily.   Yes [provider]  Garlic 300 MG CAPS Take 1 capsule by mouth daily at 2 PM.   Yes [provider]  lisinopril-hydrochlorothiazide (PRINZIDE,ZESTORETIC) 20-12.5 MG tablet TAKE 1 TABLET BY  MOUTH EVERY DAY (NEEDS OFFICE VISIT) 12/17/16  Yes Bedsole, Amy E, MD  Multiple Vitamin (MULTI VITAMIN) TABS 1 tablet Orally Once a day   Yes [provider]  aspirin 81 MG tablet Take 81 mg by mouth daily.      [provider]  CINNAMON PO Take 1 tablet by mouth daily.     [provider]  Fish Oil OIL Take one capsule by mouth once a day     [provider]  Garlic (ODOR FREE GARLIC) 100 MG TABS Take one by mouth daily     [provider]  Lancets (ONETOUCH ULTRASOFT) lancets Use to check blood sugar once daily and as needed. Dx: E11.9 04/04/15   Excell Seltzer, MD  Multiple Vitamin (MULTIVITAMIN) tablet Take 1 tablet by mouth daily.      [provider]  ONETOUCH VERIO test strip USE TO CHECK BLOOD SUGAR DAILY 02/27/16   Bedsole, Amy E, MD  Red Yeast Rice 600 MG CAPS Take 1 capsule by mouth daily.     [provider]    Family History Family History  Problem Relation Age of Onset   Hypertension Mother    Arthritis Mother        osteoarthritis   Coronary artery disease Father  MI age 34   Hypertension Father    Deep vein thrombosis Father    COPD Neg Hx     Social History Social History   Tobacco Use   Smoking status: Never   Smokeless tobacco: Never  Vaping Use   Vaping status: Never Used  Substance Use Topics   Alcohol use: No   Drug use: No     Allergies   Patient has no known allergies.   Review of Systems Review of Systems  Constitutional:  Negative for chills and fever.  Eyes:  Negative for discharge and redness.  Respiratory:  Negative for shortness of breath.   Cardiovascular:  Negative for chest pain.  Gastrointestinal:  Negative for abdominal pain, nausea and vomiting.  Musculoskeletal:  Negative for myalgias.  Neurological:  Negative for numbness.     Physical Exam Triage Vital Signs ED Triage Vitals  Encounter Vitals Group     BP 11/26/22 1546 (!) 149/73     Systolic BP  Percentile --      Diastolic BP Percentile --      Pulse Rate 11/26/22 1546 60     Resp 11/26/22 1546 18     Temp 11/26/22 1546 98.3 F (36.8 C)     Temp Source 11/26/22 1546 Oral     SpO2 11/26/22 1546 96 %     Weight 11/26/22 1543 155 lb (70.3 kg)     Height 11/26/22 1543 5\' 3"  (1.6 m)     Head Circumference --      Peak Flow --      Pain Score 11/26/22 1541 2     Pain Loc --      Pain Education --      Exclude from Growth Chart --    No data found.  Updated Vital Signs BP (!) 145/77 (BP Location: Right Arm)   Pulse 60   Temp 98.3 F (36.8 C) (Oral)   Resp 18   Ht 5\' 3"  (1.6 m)   Wt 155 lb (70.3 kg)   SpO2 96%   BMI 27.46 kg/m      Physical Exam Vitals and nursing note reviewed.  Constitutional:      General: She is not in acute distress.    Appearance: Normal appearance. She is not ill-appearing.  HENT:     Head: Normocephalic and atraumatic.  Eyes:     Conjunctiva/sclera: Conjunctivae normal.  Cardiovascular:     Rate and Rhythm: Normal rate and regular rhythm.  Pulmonary:     Effort: Pulmonary effort is normal. No respiratory distress.     Breath sounds: Normal breath sounds. No wheezing, rhonchi or rales.  Chest:     Chest wall: No tenderness.       Comments: Location of reported pain, no tenderness to palpation.  No mass or swelling noted.  No erythema. Musculoskeletal:     Comments: No TTP to left calf, no swelling appreciated to left lower leg/ ankle  Neurological:     Mental Status: She is alert.  Psychiatric:        Mood and Affect: Mood normal.        Behavior: Behavior normal.        Thought Content: Thought content normal.      UC Treatments / Results  Labs (all labs ordered are listed, but only abnormal results are displayed) Labs Reviewed - No data to display  EKG   Radiology No results found.  Procedures Procedures (including critical care time)  Medications  Ordered in UC Medications - No data to display  Initial  Impression / Assessment and Plan / UC Course  I have reviewed the triage vital signs and the nursing notes.  Pertinent labs & imaging results that were available during my care of the patient were reviewed by me and considered in my medical decision making (see chart for details).   EKG ordered with no changes from the prior compared to 2016.  Low suspicion for ACS given lack of other symptoms but did recommend if symptoms persist or worsen that she be seen in the emergency room. Advised that leg pain is also seemingly benign- no signs or symptoms concerning for blood clot.  Encouraged follow-up with any further concerns.     Final Clinical Impressions(s) / UC Diagnoses   Final diagnoses:  Breast pain, left   Discharge Instructions   None    ED Prescriptions   None    PDMP not reviewed this encounter.   Tomi Bamberger, PA-C 11/26/22 1756

## 2022-11-26 NOTE — ED Triage Notes (Signed)
"  I am having pain in my left breast, nothing I feel that is abnormal, the pain doesn't stay it comes and goes". "Last night it felt like something was sticking in the back of my lower left leg".

## 2022-12-28 DIAGNOSIS — Z1231 Encounter for screening mammogram for malignant neoplasm of breast: Secondary | ICD-10-CM | POA: Diagnosis not present

## 2023-06-07 DIAGNOSIS — E1169 Type 2 diabetes mellitus with other specified complication: Secondary | ICD-10-CM | POA: Diagnosis not present

## 2023-06-07 DIAGNOSIS — Z01419 Encounter for gynecological examination (general) (routine) without abnormal findings: Secondary | ICD-10-CM | POA: Diagnosis not present

## 2023-06-07 DIAGNOSIS — E663 Overweight: Secondary | ICD-10-CM | POA: Diagnosis not present

## 2023-06-07 DIAGNOSIS — E7800 Pure hypercholesterolemia, unspecified: Secondary | ICD-10-CM | POA: Diagnosis not present

## 2023-06-07 DIAGNOSIS — Z23 Encounter for immunization: Secondary | ICD-10-CM | POA: Diagnosis not present

## 2023-06-07 DIAGNOSIS — Z0000 Encounter for general adult medical examination without abnormal findings: Secondary | ICD-10-CM | POA: Diagnosis not present

## 2023-06-07 DIAGNOSIS — I10 Essential (primary) hypertension: Secondary | ICD-10-CM | POA: Diagnosis not present

## 2023-06-07 DIAGNOSIS — Z5181 Encounter for therapeutic drug level monitoring: Secondary | ICD-10-CM | POA: Diagnosis not present

## 2023-06-23 DIAGNOSIS — E1169 Type 2 diabetes mellitus with other specified complication: Secondary | ICD-10-CM | POA: Diagnosis not present

## 2023-06-23 DIAGNOSIS — I10 Essential (primary) hypertension: Secondary | ICD-10-CM | POA: Diagnosis not present

## 2023-06-23 DIAGNOSIS — E7800 Pure hypercholesterolemia, unspecified: Secondary | ICD-10-CM | POA: Diagnosis not present

## 2023-07-01 DIAGNOSIS — I10 Essential (primary) hypertension: Secondary | ICD-10-CM | POA: Diagnosis not present

## 2023-07-01 DIAGNOSIS — E1169 Type 2 diabetes mellitus with other specified complication: Secondary | ICD-10-CM | POA: Diagnosis not present

## 2023-07-24 DIAGNOSIS — E7800 Pure hypercholesterolemia, unspecified: Secondary | ICD-10-CM | POA: Diagnosis not present

## 2023-07-24 DIAGNOSIS — E1169 Type 2 diabetes mellitus with other specified complication: Secondary | ICD-10-CM | POA: Diagnosis not present

## 2023-07-24 DIAGNOSIS — I10 Essential (primary) hypertension: Secondary | ICD-10-CM | POA: Diagnosis not present

## 2023-07-30 DIAGNOSIS — E1169 Type 2 diabetes mellitus with other specified complication: Secondary | ICD-10-CM | POA: Diagnosis not present

## 2023-07-30 DIAGNOSIS — I10 Essential (primary) hypertension: Secondary | ICD-10-CM | POA: Diagnosis not present

## 2023-08-23 DIAGNOSIS — E1169 Type 2 diabetes mellitus with other specified complication: Secondary | ICD-10-CM | POA: Diagnosis not present

## 2023-08-23 DIAGNOSIS — E7800 Pure hypercholesterolemia, unspecified: Secondary | ICD-10-CM | POA: Diagnosis not present

## 2023-08-23 DIAGNOSIS — I10 Essential (primary) hypertension: Secondary | ICD-10-CM | POA: Diagnosis not present

## 2023-08-29 DIAGNOSIS — E1169 Type 2 diabetes mellitus with other specified complication: Secondary | ICD-10-CM | POA: Diagnosis not present

## 2023-08-29 DIAGNOSIS — I10 Essential (primary) hypertension: Secondary | ICD-10-CM | POA: Diagnosis not present

## 2023-09-21 ENCOUNTER — Ambulatory Visit
Admission: RE | Admit: 2023-09-21 | Discharge: 2023-09-21 | Disposition: A | Discharge status: Home / Self Care | Source: Ambulatory Visit | Attending: Family Medicine | Admitting: Family Medicine

## 2023-09-21 VITALS — BP 133/68 | HR 57 | Temp 98.7°F | Resp 18 | Ht 63.0 in | Wt 155.0 lb

## 2023-09-21 DIAGNOSIS — M7712 Lateral epicondylitis, left elbow: Secondary | ICD-10-CM | POA: Diagnosis not present

## 2023-09-21 NOTE — ED Provider Notes (Signed)
 [facility]    CSN: [id number] Arrival date & time: 09/21/23  1254      History   Chief Complaint Chief Complaint  Patient presents with   Arm Pain    HPI Karla Ortega is a 73 y.o. female.   L arm pain, primarily over lateral and extensor aspect of forearm X 2 weeks Noticed initially while doing some arm exercises Very occasionally feels some tingling in her fingers but denies shooting pains, numbness, weakness. Denies neck or shoulder pain Denies injury, fevers, swelling   Arm Pain    Past Medical History:  Diagnosis Date   Hypertension     Patient Active Problem List   Diagnosis Date Noted   Peripheral edema 10/15/2015   Ascending aortic aneurysm (HCC) 08/21/2013   Osteopenia 09/24/2008   HYPERCHOLESTEROLEMIA 08/28/2008   Diabetes mellitus with no complication (HCC) 11/14/2007   Essential hypertension, benign 11/14/2007   CARDIAC MURMUR, BENIGN 11/14/2007    Past Surgical History:  Procedure Laterality Date   PARTIAL HYSTERECTOMY     has left ovary.  Tubal pregnancy    OB History   No obstetric history on file.      Home Medications    Prior to Admission medications   Medication Sig Start Date End Date Taking? Authorizing Provider  lisinopril -hydrochlorothiazide  (PRINZIDE ,ZESTORETIC ) 20-12.5 MG tablet TAKE 1 TABLET BY MOUTH EVERY DAY (NEEDS OFFICE VISIT) 12/17/16  Yes Bedsole, Amy E, MD  aspirin 81 MG tablet Take 81 mg by mouth daily.      [provider]  Cholecalciferol 50 MCG (2000 UT) TABS Take 2,000 Units by mouth daily at 6 (six) AM.    [provider]  Cinnamon 500 MG capsule Take 500 mg by mouth daily.    [provider]  CINNAMON PO Take 1 tablet by mouth daily.     [provider]  Fish Oil OIL Take one capsule by mouth once a day     [provider]  Garlic (ODOR FREE GARLIC) 100 MG TABS Take one by mouth daily     [provider]  Garlic 300 MG CAPS Take 1 capsule  by mouth daily at 2 PM.    [provider]  Lancets (ONETOUCH ULTRASOFT) lancets Use to check blood sugar once daily and as needed. Dx: E11.9 04/04/15   Avelina Greig BRAVO, MD  Multiple Vitamin (MULTI VITAMIN) TABS 1 tablet Orally Once a day    [provider]  Multiple Vitamin (MULTIVITAMIN) tablet Take 1 tablet by mouth daily.      [provider]  ONETOUCH VERIO test strip USE TO CHECK BLOOD SUGAR DAILY 02/27/16   Bedsole, Amy E, MD  Red Yeast Rice 600 MG CAPS Take 1 capsule by mouth daily.     [provider]    Family History Family History  Problem Relation Age of Onset   Hypertension Mother    Arthritis Mother        osteoarthritis   Coronary artery disease Father        MI age 72   Hypertension Father    Deep vein thrombosis Father    COPD Neg Hx     Social History Social History   Tobacco Use   Smoking status: Never   Smokeless tobacco: Never  Vaping Use   Vaping status: Never Used  Substance Use Topics   Alcohol use: Yes    Comment: Occ   Drug use: No     Allergies  Patient has no known allergies.   Review of Systems Review of Systems Per HPI   Physical Exam Triage Vital Signs ED Triage Vitals  Encounter Vitals Group     BP 09/21/23 1301 133/68     Girls Systolic BP Percentile --      Girls Diastolic BP Percentile --      Boys Systolic BP Percentile --      Boys Diastolic BP Percentile --      Pulse Rate 09/21/23 1301 (!) 57     Resp 09/21/23 1301 18     Temp 09/21/23 1301 98.7 F (37.1 C)     Temp Source 09/21/23 1301 Oral     SpO2 09/21/23 1301 97 %     Weight 09/21/23 1259 154 lb 15.7 oz (70.3 kg)     Height 09/21/23 1259 5' 3 (1.6 m)     Head Circumference --      Peak Flow --      Pain Score 09/21/23 1257 7     Pain Loc --      Pain Education --      Exclude from Growth Chart --    No data found.  Updated Vital Signs BP 133/68 (BP Location: Right Arm)   Pulse (!) 57   Temp 98.7 F (37.1 C) (Oral)    Resp 18   Ht 5' 3 (1.6 m)   Wt 70.3 kg   SpO2 97%   BMI 27.45 kg/m   Visual Acuity Right Eye Distance:   Left Eye Distance:   Bilateral Distance:    Right Eye Near:   Left Eye Near:    Bilateral Near:     Physical Exam Constitutional:      General: She is not in acute distress.    Appearance: Normal appearance.  HENT:     Head: Normocephalic and atraumatic.  Pulmonary:     Effort: Pulmonary effort is normal. No respiratory distress.  Musculoskeletal:     Cervical back: Normal range of motion and neck supple. No tenderness.     Comments: Left arm: No gross deformity, ecchymosis, swelling Mildly tender to palpation over lateral epicondyle and along common extensor tendon Full range of motion (wrist, elbow) without pain 5/5 grip strength and bicep strength Pain around the lateral epicondyle with resisted wrist extension NVI distally   Neurological:     Mental Status: She is alert.      UC Treatments / Results  Labs (all labs ordered are listed, but only abnormal results are displayed) Labs Reviewed - No data to display  EKG   Radiology No results found.  Procedures Procedures (including critical care time)  Medications Ordered in UC Medications - No data to display  Initial Impression / Assessment and Plan / UC Course  I have reviewed the triage vital signs and the nursing notes.  Pertinent labs & imaging results that were available during my care of the patient were reviewed by me and considered in my medical decision making (see chart for details).     Right forearm pain x 2 weeks, pain with resisted wrist extension and tenderness over lateral epicondyle, most consistent with lateral epicondylitis  No sign of neurovascular compromise.  No sign of tendon rupture or septic arthritis.   Recommend conservative management.  Advised NSAID use regularly over the next 2-3 days followed by as needed.  Can use OTC Tylenol  as well.  Can try OTC Voltaren gel as  well.   Provided patient with rehab  exercises/stretches for tennis elbow  Pt politely declines Toradol shot here  Patient sees [facility] for PCP, do not have their records.  Most recent kidney function on file from 2017 normal.  Patient states that she just had a physical done and states she does not have any kidney disease.  Only takes blood pressure medicine. Discussed NSAID precautions with patient.     Final Clinical Impressions(s) / UC Diagnoses   Final diagnoses:  Lateral epicondylitis of left elbow     Discharge Instructions      See attached information for some exercises and stretches you can do to help with your symptoms  I recommend using Aleve (naproxen) OR ibuprofen (Advil, motrin) regularly for the next 2-3 days, then as needed to help reduce inflammation in your elbow/arm. You can also use Tylenol  to help with pain.  Look for Voltaren (diclofenac) gel at your local pharmacy -- this is basically a gel version of ibuprofen which may also help  You can follow up with your PCP who may recommend physical therapy. Please seek medical attention if you develop any fevers, swelling, or worsening of your pain      ED Prescriptions   None    PDMP not reviewed this encounter.   Romelle Booty, MD 09/21/23 1345

## 2023-09-21 NOTE — ED Triage Notes (Signed)
 I don't know if I sprain my left arm or not but it hurts (last few wks), seems to hurt from forearm to elbow, I was working out more so maybe pulled something.

## 2023-09-21 NOTE — Discharge Instructions (Signed)
 See attached information for some exercises and stretches you can do to help with your symptoms  I recommend using Aleve (naproxen) OR ibuprofen (Advil, motrin) regularly for the next 2-3 days, then as needed to help reduce inflammation in your elbow/arm. You can also use Tylenol  to help with pain.  Look for Voltaren (diclofenac) gel at your local pharmacy -- this is basically a gel version of ibuprofen which may also help  You can follow up with your PCP who may recommend physical therapy. Please seek medical attention if you develop any fevers, swelling, or worsening of your pain

## 2023-09-23 DIAGNOSIS — I10 Essential (primary) hypertension: Secondary | ICD-10-CM | POA: Diagnosis not present

## 2023-09-23 DIAGNOSIS — E7800 Pure hypercholesterolemia, unspecified: Secondary | ICD-10-CM | POA: Diagnosis not present

## 2023-09-23 DIAGNOSIS — E1169 Type 2 diabetes mellitus with other specified complication: Secondary | ICD-10-CM | POA: Diagnosis not present

## 2023-09-28 DIAGNOSIS — I10 Essential (primary) hypertension: Secondary | ICD-10-CM | POA: Diagnosis not present

## 2023-09-28 DIAGNOSIS — E1169 Type 2 diabetes mellitus with other specified complication: Secondary | ICD-10-CM | POA: Diagnosis not present

## 2023-10-12 ENCOUNTER — Encounter: Payer: Self-pay | Admitting: Emergency Medicine

## 2023-10-12 ENCOUNTER — Ambulatory Visit: Admission: EM | Admit: 2023-10-12 | Discharge: 2023-10-12 | Disposition: A | Discharge status: Home / Self Care

## 2023-10-12 DIAGNOSIS — I10 Essential (primary) hypertension: Secondary | ICD-10-CM

## 2023-10-12 NOTE — ED Triage Notes (Signed)
 Pt presents c/o hypertension. Pt states she takes Lisinopril  to maintain and says she has taken those meds today a little after 11 am. Pt says she feels anxious. Pt also reports she checked at home and it read 201/not sure of bottom number.

## 2023-10-12 NOTE — ED Provider Notes (Signed)
 [facility]    CSN: [id number] Arrival date & time: 10/12/23  1240      History   Chief Complaint Chief Complaint  Patient presents with   Hypertension    HPI Karla Ortega is a 73 y.o. female.   Patient here today for evaluation of elevated blood pressure after taking it this morning.  She reports that she has not any chest pain or shortness of breath.  She did not take her blood pressure medicine prior to checking her blood pressure this morning and it was 201 systolic.  She denies any lightheadedness or headaches.  She does states she feels anxious after having elevated blood pressure at home.  She notes after an elevated reading she did take her medication as prescribed around 11 AM.  The history is provided by the patient.  Hypertension Pertinent negatives include no chest pain, no abdominal pain, no headaches and no shortness of breath.    Past Medical History:  Diagnosis Date   Hypertension     Patient Active Problem List   Diagnosis Date Noted   Peripheral edema 10/15/2015   Ascending aortic aneurysm (HCC) 08/21/2013   Osteopenia 09/24/2008   HYPERCHOLESTEROLEMIA 08/28/2008   Diabetes mellitus with no complication (HCC) 11/14/2007   Essential hypertension, benign 11/14/2007   CARDIAC MURMUR, BENIGN 11/14/2007    Past Surgical History:  Procedure Laterality Date   PARTIAL HYSTERECTOMY     has left ovary.  Tubal pregnancy    OB History   No obstetric history on file.      Home Medications    Prior to Admission medications   Medication Sig Start Date End Date Taking? Authorizing Provider  lisinopril -hydrochlorothiazide  (PRINZIDE ,ZESTORETIC ) 20-12.5 MG tablet TAKE 1 TABLET BY MOUTH EVERY DAY (NEEDS OFFICE VISIT) 12/17/16  Yes Bedsole, Amy E, MD  aspirin 81 MG tablet Take 81 mg by mouth daily.      [provider]  Cholecalciferol 50 MCG (2000 UT) TABS Take 2,000 Units by mouth daily at 6 (six) AM.    [provider]   Cinnamon 500 MG capsule Take 500 mg by mouth daily.    [provider]  CINNAMON PO Take 1 tablet by mouth daily.     [provider]  Fish Oil OIL Take one capsule by mouth once a day     [provider]  Garlic (ODOR FREE GARLIC) 100 MG TABS Take one by mouth daily     [provider]  Garlic 300 MG CAPS Take 1 capsule by mouth daily at 2 PM.    [provider]  Lancets (ONETOUCH ULTRASOFT) lancets Use to check blood sugar once daily and as needed. Dx: E11.9 04/04/15   Avelina Greig BRAVO, MD  Multiple Vitamin (MULTI VITAMIN) TABS 1 tablet Orally Once a day    [provider]  Multiple Vitamin (MULTIVITAMIN) tablet Take 1 tablet by mouth daily.      [provider]  ONETOUCH VERIO test strip USE TO CHECK BLOOD SUGAR DAILY 02/27/16   Bedsole, Amy E, MD  Red Yeast Rice 600 MG CAPS Take 1 capsule by mouth daily.     [provider]    Family History Family History  Problem Relation Age of Onset   Hypertension Mother    Arthritis Mother        osteoarthritis   Coronary artery disease Father        MI age 94   Hypertension Father    Deep  vein thrombosis Father    COPD Neg Hx     Social History Social History   Tobacco Use   Smoking status: Never    Passive exposure: Never   Smokeless tobacco: Never  Vaping Use   Vaping status: Never Used  Substance Use Topics   Alcohol use: Yes    Comment: Occ   Drug use: No     Allergies   Patient has no known allergies.   Review of Systems Review of Systems  Constitutional:  Negative for chills and fever.  Eyes:  Negative for discharge and redness.  Respiratory:  Negative for shortness of breath.   Cardiovascular:  Negative for chest pain.  Gastrointestinal:  Negative for abdominal pain, nausea and vomiting.  Neurological:  Negative for light-headedness and headaches.     Physical Exam Triage Vital Signs ED Triage Vitals  Encounter Vitals Group     BP 10/12/23  1307 (!) 193/91     Girls Systolic BP Percentile --      Girls Diastolic BP Percentile --      Boys Systolic BP Percentile --      Boys Diastolic BP Percentile --      Pulse Rate 10/12/23 1307 (!) 58     Resp 10/12/23 1307 18     Temp 10/12/23 1307 98.6 F (37 C)     Temp Source 10/12/23 1307 Oral     SpO2 10/12/23 1307 98 %     Weight 10/12/23 1306 154 lb 15.7 oz (70.3 kg)     Height --      Head Circumference --      Peak Flow --      Pain Score 10/12/23 1305 0     Pain Loc --      Pain Education --      Exclude from Growth Chart --    No data found.  Updated Vital Signs BP (!) 152/80 (BP Location: Left Arm)   Pulse (!) 58   Temp 98.6 F (37 C) (Oral)   Resp 18   Wt 154 lb 15.7 oz (70.3 kg)   SpO2 98%   BMI 27.45 kg/m   Visual Acuity Right Eye Distance:   Left Eye Distance:   Bilateral Distance:    Right Eye Near:   Left Eye Near:    Bilateral Near:     Physical Exam Vitals and nursing note reviewed.  Constitutional:      General: She is not in acute distress.    Appearance: Normal appearance. She is not ill-appearing.  HENT:     Head: Normocephalic and atraumatic.  Eyes:     Conjunctiva/sclera: Conjunctivae normal.  Cardiovascular:     Rate and Rhythm: Normal rate and regular rhythm.  Pulmonary:     Effort: Pulmonary effort is normal. No respiratory distress.     Breath sounds: No wheezing, rhonchi or rales.  Neurological:     Mental Status: She is alert.  Psychiatric:        Mood and Affect: Mood normal.        Behavior: Behavior normal.        Thought Content: Thought content normal.      UC Treatments / Results  Labs (all labs ordered are listed, but only abnormal results are displayed) Labs Reviewed - No data to display  EKG   Radiology No results found.  Procedures Procedures (including critical care time)  Medications Ordered in UC Medications - No data to display  Initial Impression /  Assessment and Plan / UC Course  I have  reviewed the triage vital signs and the nursing notes.  Pertinent labs & imaging results that were available during my care of the patient were reviewed by me and considered in my medical decision making (see chart for details).    Blood pressure does gradually decrease in office, suspected this is related to medication dosing and timing of same. Before discharge BP is closer to her baseline readings. Recommended she continue to monitor and follow up with any concerns.   Final Clinical Impressions(s) / UC Diagnoses   Final diagnoses:  Essential hypertension, benign   Discharge Instructions   None    ED Prescriptions   None    PDMP not reviewed this encounter.   Billy Asberry FALCON, PA-C 10/12/23 1438

## 2023-10-24 DIAGNOSIS — E1169 Type 2 diabetes mellitus with other specified complication: Secondary | ICD-10-CM | POA: Diagnosis not present

## 2023-10-24 DIAGNOSIS — I10 Essential (primary) hypertension: Secondary | ICD-10-CM | POA: Diagnosis not present

## 2023-10-24 DIAGNOSIS — E7800 Pure hypercholesterolemia, unspecified: Secondary | ICD-10-CM | POA: Diagnosis not present

## 2023-10-28 DIAGNOSIS — E1169 Type 2 diabetes mellitus with other specified complication: Secondary | ICD-10-CM | POA: Diagnosis not present

## 2023-10-28 DIAGNOSIS — I10 Essential (primary) hypertension: Secondary | ICD-10-CM | POA: Diagnosis not present

## 2023-11-23 DIAGNOSIS — I10 Essential (primary) hypertension: Secondary | ICD-10-CM | POA: Diagnosis not present

## 2023-11-23 DIAGNOSIS — E7800 Pure hypercholesterolemia, unspecified: Secondary | ICD-10-CM | POA: Diagnosis not present

## 2023-11-23 DIAGNOSIS — E1169 Type 2 diabetes mellitus with other specified complication: Secondary | ICD-10-CM | POA: Diagnosis not present

## 2023-11-27 DIAGNOSIS — I10 Essential (primary) hypertension: Secondary | ICD-10-CM | POA: Diagnosis not present

## 2023-11-27 DIAGNOSIS — E1169 Type 2 diabetes mellitus with other specified complication: Secondary | ICD-10-CM | POA: Diagnosis not present

## 2023-12-10 ENCOUNTER — Encounter: Payer: Self-pay | Admitting: Emergency Medicine

## 2023-12-10 ENCOUNTER — Ambulatory Visit
Admission: EM | Admit: 2023-12-10 | Discharge: 2023-12-10 | Disposition: A | Discharge status: Home / Self Care | Attending: Nurse Practitioner | Admitting: Nurse Practitioner

## 2023-12-10 DIAGNOSIS — I160 Hypertensive urgency: Secondary | ICD-10-CM

## 2023-12-10 DIAGNOSIS — I10 Essential (primary) hypertension: Secondary | ICD-10-CM | POA: Insufficient documentation

## 2023-12-10 DIAGNOSIS — E2839 Other primary ovarian failure: Secondary | ICD-10-CM | POA: Insufficient documentation

## 2023-12-10 MED ORDER — OLMESARTAN MEDOXOMIL-HCTZ 20-12.5 MG PO TABS: 1.0000 | ORAL_TABLET | Freq: Every day | ORAL | 0 refills | Status: DC

## 2023-12-10 MED ORDER — CLONIDINE HCL 0.1 MG PO TABS
0.1000 mg | ORAL_TABLET | Freq: Once | ORAL | Status: AC
  Administered 2023-12-10: 0.1 mg via ORAL

## 2023-12-10 NOTE — ED Provider Notes (Signed)
 [facility]    CSN: [id number] Arrival date & time: 12/10/23  9046      History   Chief Complaint Chief Complaint  Patient presents with   Hypertension    HPI Karla Ortega is a 73 y.o. female.   Discussed the use of AI scribe software for clinical note transcription with the patient, who gave verbal consent to proceed.   The patient presents with elevated blood pressure readings, with home measurements in the 170s and this morning's reading of 208/99. She monitors her blood pressure daily as directed by her doctor, and her readings typically fluctuate between 120-140, sometimes reaching 130. Yesterday, she experienced a severe headache that radiated to her back, which improved after taking Tylenol  and is now mild. This morning, she felt anxious with heart racing, noting she has a known heart murmur. She is currently taking a combination pill of lisinopril  and hydrochlorothiazide  once daily, prescribed by her primary care doctor Dr. Celeste whom she saw in July, and reports taking her medication every morning without missing doses. A couple of years ago, her lip had swollen, but she attributed it to her toothpaste. She denies dizziness, numbness, speech problems, vision issues, chest pain, shortness of breath, swelling in feet, legs, or ankles, palpitations, nausea, vomiting, or recent illness.  The following sections of the patient's history were reviewed and updated as appropriate: allergies, current medications, past family history, past medical history, past social history, past surgical history, and problem list.         Past Medical History:  Diagnosis Date   Hypertension     Patient Active Problem List   Diagnosis Date Noted   Decreased estrogen level 12/10/2023   Hypertension 12/10/2023   Peripheral edema 10/15/2015   Ascending aortic aneurysm 08/21/2013   Osteopenia 09/24/2008   HYPERCHOLESTEROLEMIA 08/28/2008   Diabetes mellitus (HCC) 11/14/2007    Essential hypertension, benign 11/14/2007   CARDIAC MURMUR, BENIGN 11/14/2007    Past Surgical History:  Procedure Laterality Date   PARTIAL HYSTERECTOMY     has left ovary.  Tubal pregnancy    OB History   No obstetric history on file.      Home Medications    Prior to Admission medications   Medication Sig Start Date End Date Taking? Authorizing Provider  Cholecalciferol 50 MCG (2000 UT) TABS Take 2,000 Units by mouth daily at 6 (six) AM.   Yes [provider]  Cinnamon 500 MG capsule Take 500 mg by mouth daily.   Yes [provider]  Fish Oil OIL Take one capsule by mouth once a day    Yes [provider]  Garlic (ODOR FREE GARLIC) 100 MG TABS Take one by mouth daily    Yes [provider]  Multiple Vitamin (MULTI VITAMIN) TABS 1 tablet Orally Once a day   Yes [provider]  olmesartan-hydrochlorothiazide  (BENICAR HCT) 20-12.5 MG tablet Take 1 tablet by mouth daily. 12/11/23  Yes Iola Lukes, FNP  aspirin 81 MG tablet Take 81 mg by mouth daily.   Patient not taking: Reported on 12/10/2023    [provider]  Lancets [facility] ULTRASOFT) lancets Use to check blood sugar once daily and as needed. Dx: E11.9 04/04/15   Avelina Greig BRAVO, MD  ONETOUCH VERIO test strip USE TO CHECK BLOOD SUGAR DAILY 02/27/16   Bedsole, Amy E, MD  Red Yeast Rice 600 MG CAPS Take 1 capsule by mouth daily.  Patient not taking: Reported on 12/10/2023  [provider]    Family History Family History  Problem Relation Age of Onset   Hypertension Mother    Arthritis Mother        osteoarthritis   Coronary artery disease Father        MI age 30   Hypertension Father    Deep vein thrombosis Father    COPD Neg Hx     Social History Social History   Tobacco Use   Smoking status: Never    Passive exposure: Never   Smokeless tobacco: Never  Vaping Use   Vaping status: Never Used  Substance Use Topics   Alcohol use: Yes     Comment: Occ   Drug use: No     Allergies   Lisinopril    Review of Systems Review of Systems  Constitutional: Negative.   HENT: Negative.    Eyes:  Negative for photophobia and visual disturbance.  Respiratory:  Negative for cough and shortness of breath.   Cardiovascular:  Positive for palpitations (this morning). Negative for chest pain and leg swelling.  Gastrointestinal:  Negative for nausea and vomiting.  Neurological:  Positive for headaches. Negative for dizziness, speech difficulty and numbness.  All other systems reviewed and are negative.    Physical Exam Triage Vital Signs ED Triage Vitals [12/10/23 1047]  Encounter Vitals Group     BP (!) 171/79     Girls Systolic BP Percentile      Girls Diastolic BP Percentile      Boys Systolic BP Percentile      Boys Diastolic BP Percentile      Pulse Rate (!) 54     Resp 14     Temp 98.4 F (36.9 C)     Temp Source Oral     SpO2 97 %     Weight      Height      Head Circumference      Peak Flow      Pain Score 3     Pain Loc      Pain Education      Exclude from Growth Chart    No data found.  Updated Vital Signs BP (!) 167/82 (BP Location: Left Arm)   Pulse (!) 54   Temp 98.4 F (36.9 C) (Oral)   Resp 14   SpO2 97%   Visual Acuity Right Eye Distance:   Left Eye Distance:   Bilateral Distance:    Right Eye Near:   Left Eye Near:    Bilateral Near:     Physical Exam Vitals reviewed.  Constitutional:      General: She is awake. She is not in acute distress.    Appearance: Normal appearance. She is well-developed. She is not ill-appearing, toxic-appearing or diaphoretic.  HENT:     Head: Normocephalic.     Right Ear: Hearing normal.     Left Ear: Hearing normal.     Nose: Nose normal.     Mouth/Throat:     Mouth: Mucous membranes are moist.  Eyes:     General: Vision grossly intact.     Conjunctiva/sclera: Conjunctivae normal.  Cardiovascular:     Rate and Rhythm: Normal rate and regular  rhythm.     Pulses: Normal pulses.     Heart sounds: Murmur heard.  Pulmonary:     Effort: Pulmonary effort is normal.     Breath sounds: Normal breath sounds and air entry.  Musculoskeletal:        General:  Normal range of motion.     Cervical back: Normal range of motion and neck supple.     Right lower leg: No edema.     Left lower leg: No edema.  Skin:    General: Skin is warm and dry.  Neurological:     General: No focal deficit present.     Mental Status: She is alert and oriented to person, place, and time.  Psychiatric:        Speech: Speech normal.        Behavior: Behavior is cooperative.      UC Treatments / Results  Labs (all labs ordered are listed, but only abnormal results are displayed) Labs Reviewed - No data to display  EKG   Radiology No results found.  Procedures Procedures (including critical care time)  Medications Ordered in UC Medications  cloNIDine (CATAPRES) tablet 0.1 mg (0.1 mg Oral Given 12/10/23 1206)    Initial Impression / Assessment and Plan / UC Course  I have reviewed the triage vital signs and the nursing notes.  Pertinent labs & imaging results that were available during my care of the patient were reviewed by me and considered in my medical decision making (see chart for details).     The patient presents with markedly elevated blood pressure readings, consistent with hypertensive crisis. Home blood pressure this morning was 208/99 mmHg, and clinic measurement was 171/79 mmHg. The patient is compliant with current lisinopril /hydrochlorothiazide  therapy and typically maintains readings in the 120-140 mmHg range. She reports a severe headache yesterday that radiated to the back of her head, which improved with Tylenol  and is now mild. This morning, she experienced anxiety and palpitations. Past medical history is notable for a heart murmur and a prior episode of lip swelling several years ago, initially attributed to toothpaste but  concerning for possible angioedema related to ACE inhibitor use.  Given the history and current presentation, lisinopril /hydrochlorothiazide  was discontinued due to possible ACE inhibitor sensitivity. Lisinopril  was added to her list of allergies as a precaution. Olmesartan with hydrochlorothiazide  was started as replacement therapy. Clonidine 0.1 mg was administered in clinic for acute blood pressure reduction, and blood pressure will be rechecked prior to discharge. The patient was counseled on proper home blood pressure monitoring technique and advised to record daily readings. Lifestyle recommendations were reviewed, including limiting salt intake, increasing hydration, and monitoring for symptoms of elevated blood pressure.  The patient expressed a desire to change her primary care provider from Dr. Rexanne to Amy, NP at [facility]; assistance will be provided to facilitate this transition. A sufficient quantity of medication will be supplied to bridge care until the new appointment. Emergency precautions were discussed, including seeking immediate care for severe headache, vision changes, numbness, tingling, chest pain, shortness of breath, severe nausea or vomiting, or blood pressure readings above 190-200 mmHg.  Today's evaluation has revealed no signs of a dangerous process. Discussed diagnosis with patient and/or guardian. Patient and/or guardian aware of their diagnosis, possible red flag symptoms to watch out for and need for close follow up. Patient and/or guardian understands verbal and written discharge instructions. Patient and/or guardian comfortable with plan and disposition.  Patient and/or guardian has a clear mental status at this time, good insight into illness (after discussion and teaching) and has clear judgment to make decisions regarding their care  Documentation was completed with the aid of voice recognition software. Transcription may contain typographical  errors.   Final Clinical Impressions(s) / UC Diagnoses  Final diagnoses:  Hypertensive urgency     Discharge Instructions      You were seen today for very high blood pressure readings, which are consistent with a hypertensive crisis. Your blood pressure was 208/99 mmHg at home and 171/79 mmHg in the clinic. You reported a severe headache yesterday that improved with Tylenol  and mild anxiety and heart racing this morning. Because you previously experienced lip swelling that may have been caused by lisinopril , that medication has been stopped and added to your allergy list as a precaution. You were started on a new blood pressure medication called olmesartan with hydrochlorothiazide , which should help control your blood pressure safely. A dose of clonidine was given in the clinic today to help lower your blood pressure, and your pressure improved. You should check your blood pressure at home daily, around the same time each day, using proper technique. Sit with your feet flat on the floor and arm supported at heart level. Write down your readings and bring them to your next appointment. Reduce salt intake by limiting canned and packaged foods, drink plenty of water, and avoid skipping doses of your new medication. Continue taking your cholesterol medication and other prescriptions as usual.  You will be switching your primary care provider to Amy, NP, at [facility]. Your appointment is on January 26, 2024. I have prescribed enough of the new blood pressure medication until then. Go to the emergency room right away if your blood pressure remains above 190-200 mmHg or if you develop a severe headache, vision changes, chest pain, shortness of breath, numbness, tingling, weakness, or severe nausea or vomiting. These could be signs of dangerously high blood pressure that require urgent treatment.      ED Prescriptions     Medication Sig Dispense Auth. Provider    olmesartan-hydrochlorothiazide  (BENICAR HCT) 20-12.5 MG tablet Take 1 tablet by mouth daily. 50 tablet Iola Lukes, FNP      PDMP not reviewed this encounter.   Iola Lukes, [place] 12/10/23 [phone]

## 2023-12-10 NOTE — ED Triage Notes (Addendum)
 Pt reports higher blood pressure readings that started last night after pt developed a headache. Denies vision changes, lightheadedness, or dizziness. Pt reports systolic in 170s last night, but came in today after her morning reading of 208/99. She said it has not jumped up this high in awhile. Continues her regular lisinopril -hydrochlorothiazide  dose. Denies new stressors, etc. No additional med use for symptoms. 171/79 in triage. Denies chest pain and SOB. Uses forearm cuff at home to check BP.

## 2023-12-10 NOTE — Discharge Instructions (Addendum)
 You were seen today for very high blood pressure readings, which are consistent with a hypertensive crisis. Your blood pressure was 208/99 mmHg at home and 171/79 mmHg in the clinic. You reported a severe headache yesterday that improved with Tylenol  and mild anxiety and heart racing this morning. Because you previously experienced lip swelling that may have been caused by lisinopril , that medication has been stopped and added to your allergy list as a precaution. You were started on a new blood pressure medication called olmesartan with hydrochlorothiazide , which should help control your blood pressure safely. A dose of clonidine was given in the clinic today to help lower your blood pressure, and your pressure improved. You should check your blood pressure at home daily, around the same time each day, using proper technique. Sit with your feet flat on the floor and arm supported at heart level. Write down your readings and bring them to your next appointment. Reduce salt intake by limiting canned and packaged foods, drink plenty of water, and avoid skipping doses of your new medication. Continue taking your cholesterol medication and other prescriptions as usual.   You will be switching your primary care provider to Amy, NP, at [facility]. Your appointment is on January 26, 2024. I have prescribed enough of the new blood pressure medication until then.   Go to the emergency room right away if your blood pressure remains above 190-200 mmHg or if you develop a severe headache, vision changes, chest pain, shortness of breath, numbness, tingling, weakness, or severe nausea or vomiting. These could be signs of dangerously high blood pressure that require urgent treatment.

## 2023-12-20 ENCOUNTER — Encounter (HOSPITAL_COMMUNITY): Payer: Self-pay

## 2023-12-20 ENCOUNTER — Other Ambulatory Visit: Payer: Self-pay

## 2023-12-20 ENCOUNTER — Emergency Department (HOSPITAL_COMMUNITY)
Admission: EM | Admit: 2023-12-20 | Discharge: 2023-12-21 | Disposition: A | Attending: Emergency Medicine | Admitting: Emergency Medicine

## 2023-12-20 ENCOUNTER — Emergency Department (HOSPITAL_COMMUNITY)

## 2023-12-20 DIAGNOSIS — R519 Headache, unspecified: Secondary | ICD-10-CM | POA: Diagnosis not present

## 2023-12-20 DIAGNOSIS — I159 Secondary hypertension, unspecified: Secondary | ICD-10-CM | POA: Diagnosis not present

## 2023-12-20 DIAGNOSIS — E119 Type 2 diabetes mellitus without complications: Secondary | ICD-10-CM | POA: Diagnosis not present

## 2023-12-20 DIAGNOSIS — Z79899 Other long term (current) drug therapy: Secondary | ICD-10-CM | POA: Diagnosis not present

## 2023-12-20 DIAGNOSIS — Z7982 Long term (current) use of aspirin: Secondary | ICD-10-CM | POA: Insufficient documentation

## 2023-12-20 DIAGNOSIS — I10 Essential (primary) hypertension: Secondary | ICD-10-CM | POA: Diagnosis not present

## 2023-12-20 NOTE — ED Triage Notes (Signed)
 Pt. Arrives with sister c/o high blood pressure. Recently prescribed medication for her BP. States that she is compliant with the medication. Endorses headache and back pain. Denies N&V

## 2023-12-21 LAB — CBC WITH DIFFERENTIAL/PLATELET
Abs Immature Granulocytes: 0.02 K/uL (ref 0.00–0.07)
Basophils Absolute: 0 K/uL (ref 0.0–0.1)
Basophils Relative: 0 %
Eosinophils Absolute: 0.1 K/uL (ref 0.0–0.5)
Eosinophils Relative: 1 %
HCT: 36.2 % (ref 36.0–46.0)
Hemoglobin: 11.3 g/dL — ABNORMAL LOW (ref 12.0–15.0)
Immature Granulocytes: 0 %
Lymphocytes Relative: 26 %
Lymphs Abs: 1.9 K/uL (ref 0.7–4.0)
MCH: 29.4 pg (ref 26.0–34.0)
MCHC: 31.2 g/dL (ref 30.0–36.0)
MCV: 94.3 fL (ref 80.0–100.0)
Monocytes Absolute: 0.4 K/uL (ref 0.1–1.0)
Monocytes Relative: 5 %
Neutro Abs: 4.9 K/uL (ref 1.7–7.7)
Neutrophils Relative %: 68 %
Platelets: 210 K/uL (ref 150–400)
RBC: 3.84 MIL/uL — ABNORMAL LOW (ref 3.87–5.11)
RDW: 14.2 % (ref 11.5–15.5)
WBC: 7.3 K/uL (ref 4.0–10.5)
nRBC: 0 % (ref 0.0–0.2)

## 2023-12-21 LAB — BASIC METABOLIC PANEL WITH GFR
Anion gap: 9 (ref 5–15)
BUN: 13 mg/dL (ref 8–23)
CO2: 27 mmol/L (ref 22–32)
Calcium: 9.6 mg/dL (ref 8.9–10.3)
Chloride: 104 mmol/L (ref 98–111)
Creatinine, Ser: 0.67 mg/dL (ref 0.44–1.00)
GFR, Estimated: >60 mL/min (ref >60)
Glucose, Bld: 99 mg/dL (ref 70–99)
Potassium: 3.8 mmol/L (ref 3.5–5.1)
Sodium: 139 mmol/L (ref 135–145)

## 2023-12-21 LAB — TROPONIN T, HIGH SENSITIVITY: Troponin T High Sensitivity: <15 ng/L (ref 0–19)

## 2023-12-21 MED ORDER — ACETAMINOPHEN 500 MG PO TABS
1000.0000 mg | ORAL_TABLET | Freq: Once | ORAL | Status: AC
  Administered 2023-12-21: 1000 mg via ORAL
  Filled 2023-12-21: qty 2

## 2023-12-21 NOTE — Discharge Instructions (Signed)
 Your workup this evening was reassuring.  Please follow-up with your primary care provider for further adjustments of your blood pressure medication as needed.  You may use the provided log to help keep track of your daily blood pressures.  It is recommended that you take these same time every day to monitor for trends.  Continue to take your currently prescribed blood pressure medication.

## 2023-12-21 NOTE — ED Provider Notes (Signed)
 [place] EMERGENCY DEPARTMENT AT [facility] Provider Note   CSN: [id number] Arrival date & time: 12/20/23  2128     Patient presents with: Hypertension   Karla Ortega is a 73 y.o. female.  Patient with past medical history sniffer type II DM, hypertension presents to the emergency department concern for high blood pressure.  She states her lisinopril  was changed to olmesartan approximately week ago.  Today she noted her blood pressure was slightly elevated.  She then developed a headache and noticed that her blood pressure did climb to approximately 180 systolic.  She came to the emergency department for evaluation.  She endorses being compliant with her medication.  At the time of arrival she complains of a mild generalized headache.  She denies chest pain, abdominal pain, nausea, vomiting, shortness of breath, vision changes.  Initial blood pressure here was also elevated at 198 systolic with a subsequent blood pressure being in the 156 systolic range.    Hypertension       Prior to Admission medications   Medication Sig Start Date End Date Taking? Authorizing Provider  aspirin 81 MG tablet Take 81 mg by mouth daily.   Patient not taking: Reported on 12/10/2023    [provider]  Cholecalciferol 50 MCG (2000 UT) TABS Take 2,000 Units by mouth daily at 6 (six) AM.    [provider]  Cinnamon 500 MG capsule Take 500 mg by mouth daily.    [provider]  Fish Oil OIL Take one capsule by mouth once a day     [provider]  Garlic (ODOR FREE GARLIC) 100 MG TABS Take one by mouth daily     [provider]  Lancets (ONETOUCH ULTRASOFT) lancets Use to check blood sugar once daily and as needed. Dx: E11.9 04/04/15   Avelina Greig BRAVO, MD  Multiple Vitamin (MULTI VITAMIN) TABS 1 tablet Orally Once a day    [provider]  olmesartan-hydrochlorothiazide  (BENICAR HCT) 20-12.5 MG tablet Take 1 tablet by mouth daily. 12/11/23    Iola Lukes, FNP  ONETOUCH VERIO test strip USE TO CHECK BLOOD SUGAR DAILY 02/27/16   Bedsole, Amy E, MD  Red Yeast Rice 600 MG CAPS Take 1 capsule by mouth daily.  Patient not taking: Reported on 12/10/2023    [provider]    Allergies: Lisinopril     Review of Systems  Updated Vital Signs BP (!) 145/68   Pulse (!) 49   Temp 98.9 F (37.2 C)   Resp 16   SpO2 100%   Physical Exam Vitals and nursing note reviewed.  Constitutional:      General: She is not in acute distress.    Appearance: She is well-developed.  HENT:     Head: Normocephalic and atraumatic.  Eyes:     Extraocular Movements: Extraocular movements intact.     Conjunctiva/sclera: Conjunctivae normal.     Pupils: Pupils are equal, round, and reactive to light.  Cardiovascular:     Rate and Rhythm: Normal rate and regular rhythm.  Pulmonary:     Effort: Pulmonary effort is normal. No respiratory distress.     Breath sounds: Normal breath sounds.  Musculoskeletal:        General: No swelling.     Cervical back: Neck supple.  Skin:    General: Skin is warm and dry.     Capillary Refill: Capillary refill takes less than 2 seconds.  Neurological:     Mental Status: She  is alert.  Psychiatric:        Mood and Affect: Mood normal.     (all labs ordered are listed, but only abnormal results are displayed) Labs Reviewed  CBC WITH DIFFERENTIAL/PLATELET - Abnormal; Notable for the following components:      Result Value   RBC 3.84 (*)    Hemoglobin 11.3 (*)    All other components within normal limits  BASIC METABOLIC PANEL WITH GFR  TROPONIN T, HIGH SENSITIVITY    EKG: None  Radiology: CT Head Wo Contrast Result Date: 12/20/2023 EXAM: CT HEAD WITHOUT CONTRAST 12/20/2023 11:31:41 PM TECHNIQUE: CT of the head was performed without the administration of intravenous contrast. Automated exposure control, iterative reconstruction, and/or weight based adjustment of the mA/kV was utilized to  reduce the radiation dose to as low as reasonably achievable. COMPARISON: Remote prior examination of 07/30/2003. CLINICAL HISTORY: Headache, new onset (Age >= 51y). Table formatting from the original note was not included.; Notes from triage:; Pt. Arrives with sister c/o high blood pressure. Recently prescribed medication for her BP. States that she is compliant with the medication. Endorses headache and back pain. Denies N\T\V FINDINGS: BRAIN AND VENTRICLES: No acute hemorrhage. No evidence of acute infarct. No hydrocephalus. No extra-axial collection. No mass effect or midline shift. Parenchymal volume loss is commensurate with the patient's age. Moderate subcortical and periventricular white matter changes are present likely reflecting the sequela of small vessel ischemia. When compared to remote prior examination of 07/30/2003, these changes appear mildly progressive. ORBITS: No acute abnormality. SINUSES: No acute abnormality. SOFT TISSUES AND SKULL: No acute soft tissue abnormality. No skull fracture. IMPRESSION: 1. No acute intracranial abnormality. 2. Moderate subcortical and periventricular white matter changes, likely sequela of small vessel ischemia, mildly progressive compared to prior examination of 07/30/2003. Electronically signed by: Dorethia Molt MD 12/20/2023 11:38 PM EDT RP Workstation: [id number]     Procedures   Medications Ordered in the ED  acetaminophen  (TYLENOL ) tablet 1,000 mg (1,000 mg Oral Given 12/21/23 0137)                                    Medical Decision Making Amount and/or Complexity of Data Reviewed Labs: ordered. Radiology: ordered.  Risk OTC drugs.   This patient presents to the ED for concern of hypertension, this involves an extensive number of treatment options, and is a complaint that carries with it a high risk of complications and morbidity.  The differential diagnosis includes hypertension, hypertensive crisis with endorgan damage, intracranial  abnormality, decreased kidney function, elevated troponins, others   Co morbidities / Chronic conditions that complicate the patient evaluation  Hypertension   Additional history obtained:  Additional history obtained from EMR External records from outside source obtained and reviewed including urgent care notes, patient having medication changed to olmesartan HCTZ   Lab Tests:  I Ordered, and personally interpreted labs.  The pertinent results include: Negative troponin, grossly unremarkable BMP, CBC   Imaging Studies ordered:  I ordered imaging studies including head CT I independently visualized and interpreted imaging which showed no acute findings I agree with the radiologist interpretation   Problem List / ED Course / Critical interventions / Medication management   I ordered medication including Tylenol  Reevaluation of the patient after these medicines showed that the patient improved I have reviewed the patients home medicines and have made adjustments as needed   Test / Admission - Considered:  Patient with mild headache related to her blood pressure.  Blood pressure improved without intervention.  Blood pressure is currently 126 systolic.  Her headache has improved.  Labs are reassuring.  No indication for further emergent workup.  No signs of endorgan damage.  Patient stable for outpatient follow-up with her PCP      Final diagnoses:  Secondary hypertension    ED Discharge Orders     None          Logan Ubaldo KATHEE DEVONNA 12/21/23 0142    Theadore Ozell HERO, MD 12/21/23 [phone]

## 2023-12-23 DIAGNOSIS — E1169 Type 2 diabetes mellitus with other specified complication: Secondary | ICD-10-CM | POA: Diagnosis not present

## 2023-12-23 DIAGNOSIS — E7800 Pure hypercholesterolemia, unspecified: Secondary | ICD-10-CM | POA: Diagnosis not present

## 2023-12-23 DIAGNOSIS — M25529 Pain in unspecified elbow: Secondary | ICD-10-CM | POA: Diagnosis not present

## 2023-12-23 DIAGNOSIS — I10 Essential (primary) hypertension: Secondary | ICD-10-CM | POA: Diagnosis not present

## 2023-12-27 DIAGNOSIS — E1169 Type 2 diabetes mellitus with other specified complication: Secondary | ICD-10-CM | POA: Diagnosis not present

## 2023-12-27 DIAGNOSIS — I10 Essential (primary) hypertension: Secondary | ICD-10-CM | POA: Diagnosis not present

## 2023-12-30 DIAGNOSIS — I10 Essential (primary) hypertension: Secondary | ICD-10-CM | POA: Diagnosis not present

## 2023-12-30 DIAGNOSIS — F419 Anxiety disorder, unspecified: Secondary | ICD-10-CM | POA: Diagnosis not present

## 2024-01-05 DIAGNOSIS — Z1231 Encounter for screening mammogram for malignant neoplasm of breast: Secondary | ICD-10-CM | POA: Diagnosis not present

## 2024-01-06 DIAGNOSIS — F419 Anxiety disorder, unspecified: Secondary | ICD-10-CM | POA: Diagnosis not present

## 2024-01-06 DIAGNOSIS — I10 Essential (primary) hypertension: Secondary | ICD-10-CM | POA: Diagnosis not present

## 2024-01-12 ENCOUNTER — Ambulatory Visit: Attending: Cardiovascular Disease | Admitting: Cardiovascular Disease

## 2024-01-12 ENCOUNTER — Encounter: Payer: Self-pay | Admitting: Cardiovascular Disease

## 2024-01-12 VITALS — BP 130/56 | HR 56 | Ht 62.0 in | Wt 165.8 lb

## 2024-01-12 DIAGNOSIS — R7303 Prediabetes: Secondary | ICD-10-CM | POA: Diagnosis not present

## 2024-01-12 DIAGNOSIS — I10 Essential (primary) hypertension: Secondary | ICD-10-CM | POA: Diagnosis not present

## 2024-01-12 DIAGNOSIS — E782 Mixed hyperlipidemia: Secondary | ICD-10-CM | POA: Insufficient documentation

## 2024-01-12 DIAGNOSIS — I7121 Aneurysm of the ascending aorta, without rupture: Secondary | ICD-10-CM | POA: Diagnosis not present

## 2024-01-12 DIAGNOSIS — I358 Other nonrheumatic aortic valve disorders: Secondary | ICD-10-CM | POA: Insufficient documentation

## 2024-01-12 NOTE — Patient Instructions (Signed)
 Medication Instructions:  No changes *If you need a refill on your cardiac medications before your next appointment, please call your pharmacy*  Lab Work: None ordered If you have labs (blood work) drawn today and your tests are completely normal, you will receive your results only by: MyChart Message (if you have MyChart) OR A paper copy in the mail If you have any lab test that is abnormal or we need to change your treatment, we will call you to review the results.  Testing/Procedures: None ordered  Follow-Up: At [facility], you and your health needs are our priority.  As part of our continuing mission to provide you with exceptional heart care, our providers are all part of one team.  This team includes your primary Cardiologist (physician) and Advanced Practice Providers or APPs (Physician Assistants and Nurse Practitioners) who all work together to provide you with the care you need, when you need it.  Your next appointment:    Follow up as needed  Provider:   Dr Francyne  We recommend signing up for the patient portal called MyChart.  Sign up information is provided on this After Visit Summary.  MyChart is used to connect with patients for Virtual Visits (Telemedicine).  Patients are able to view lab/test results, encounter notes, upcoming appointments, etc.  Non-urgent messages can be sent to your provider as well.   To learn more about what you can do with MyChart, go to [email or web address].

## 2024-01-12 NOTE — Progress Notes (Signed)
 Cardiology Office Note:    Date:  01/15/2024   ID:  Karla Ortega, DOB 05-12-1950, MRN [id number]  PCP:  Rexanne Ingle, MD   [facility] Providers Cardiologist:  None     Referring MD: Rexanne Ingle, MD   Chief Complaint  Patient presents with   Hypertension  Karla Ortega is a 73 y.o. female who is being seen today for the evaluation of dilated ascending aorta HTN at the request of Rexanne Ingle, MD.   History of Present Illness:    Karla Ortega is a 73 y.o. female with hypertension who presents with fluctuating blood pressure readings.  She experiences fluctuating blood pressure readings, with recent measurements reaching as high as 200/100. Her blood pressure was 168 when checked at home yesterday. She has had multiple visits to urgent care and the emergency room due to elevated blood pressure. Her blood pressure tends to rise when she becomes anxious about it, despite efforts to remain calm before taking measurements.  She is currently taking hydrochlorothiazide  and olmesartan  for her blood pressure. She has been prescribed anxiety medication but prefers not to take it, opting instead for alternative methods such as breathing exercises and listening to peaceful music to manage her anxiety.  She has a history of an enlarged aorta, first noted during an echocardiogram approximately ten years ago, and currently followed through the CV surgery clinic. She is scheduled for follow-up imaging next year to remeasure the size of the aorta. She also mentions having a heart murmur, which she understands to be hereditary. An echocardiogram in the past showed no significant valve problems.  She mentions sometimes needing to catch her breath while walking, which she initially thought might be related to palpitations.  Although she reports elevated blood pressure at home, in the clinic today it is normal at 130/56.  A week ago when the CT surgery clinic her blood pressure was  130/70.  She has elevated hemoglobin A1c at 6.3% in prediabetes range and mildly elevated triglycerides at 158.  LDL is acceptable 103 and she has an excellent HDL at 84.  Past Medical History:  Diagnosis Date   Hypertension     Past Surgical History:  Procedure Laterality Date   PARTIAL HYSTERECTOMY     has left ovary.  Tubal pregnancy    Current Medications: Current Meds  Medication Sig   amLODipine (NORVASC) 2.5 MG tablet Take 2.5 mg by mouth daily.   aspirin 81 MG tablet Take 81 mg by mouth daily.     Calcium Carb-Cholecalciferol 500-2.5 MG-MCG CHEW Chew 2 tablets by mouth daily at 6 (six) AM.   Cholecalciferol 50 MCG (2000 UT) TABS Take 2,000 Units by mouth daily at 6 (six) AM.   Cinnamon 500 MG capsule Take 500 mg by mouth daily.   Garlic (ODOR FREE GARLIC) 100 MG TABS Take one by mouth daily    Lancets (ONETOUCH ULTRASOFT) lancets Use to check blood sugar once daily and as needed. Dx: E11.9   Multiple Vitamin (MULTI VITAMIN) TABS 1 tablet Orally Once a day   olmesartan -hydrochlorothiazide  (BENICAR  HCT) 40-12.5 MG tablet Take 1 tablet by mouth daily.   ONETOUCH VERIO test strip USE TO CHECK BLOOD SUGAR DAILY     Allergies:   Lisinopril       Family History: The patient's ]family history includes Arthritis in her mother; Coronary artery disease in her father; Deep vein thrombosis in her father; Hypertension in her father and mother. There is no history of COPD.  ROS:   Please see the history of present illness.     All other systems reviewed and are negative.  EKGs/Labs/Other Studies Reviewed:    The following studies were reviewed today: Reviewed the  - echocardiogram from 2015 that does not show any meaningful valvular abnormalities. - most recent CT angiogram of the chest from 10/19/2022, with slices that go low enough to get good images of the celiac artery, superior mesenteric artery and both renal arteries in their entirety.  There is no evidence of any  atherosclerotic calcification in the aorta, the great arch branches, the coronary arteries or the visceral arteries in the abdomen.  The renal arteries are very well outlined and there is no evidence of renal artery stenosis.  The ascending is clearly much larger than the descending aorta, although the maximum diameter is around 4 cm.  EKG Interpretation Date/Time:  Wednesday January 12 2024 13:55:34 EST Ventricular Rate:  56 PR Interval:  130 QRS Duration:  74 QT Interval:  406 QTC Calculation: 391 R Axis:   28  Text Interpretation: Sinus bradycardia Nonspecific T wave abnormality When compared with ECG of 26-Nov-2022 16:59, No significant change was found Confirmed by Jorah Hua [phone]) on 01/12/2024 2:03:35 PM    Recent Labs: 12/21/2023: BUN 13; Creatinine, Ser 0.67; Hemoglobin 11.3; Platelets 210; Potassium 3.8; Sodium 139  Recent Lipid Panel    Component Value Date/Time   CHOL 210 (H) 10/15/2015 1408   TRIG 116.0 10/15/2015 1408   HDL 75.30 10/15/2015 1408   CHOLHDL 3 10/15/2015 1408   VLDL 23.2 10/15/2015 1408   LDLCALC 112 (H) 10/15/2015 1408   LDLDIRECT 125.3 02/23/2012 1109     Risk Assessment/Calculations:             Physical Exam:    VS:  BP (!) 130/56 (BP Location: Left Arm, Patient Position: Sitting, Cuff Size: Normal)   Pulse (!) 56   Ht 5' 2 (1.575 m)   Wt 165 lb 12.8 oz (75.2 kg)   SpO2 96%   BMI 30.33 kg/m     Wt Readings from Last 3 Encounters:  01/12/24 165 lb 12.8 oz (75.2 kg)  10/12/23 154 lb 15.7 oz (70.3 kg)  09/21/23 154 lb 15.7 oz (70.3 kg)     GEN:  Well nourished, well developed in no acute distress HEENT: Normal NECK: No JVD; No carotid bruits LYMPHATICS: No lymphadenopathy CARDIAC: RRR, 1/6 early peaking ejection murmur heard best at the left upper sternal border, no diastolic murmurs, rubs, gallops RESPIRATORY:  Clear to auscultation without rales, wheezing or rhonchi  ABDOMEN: Soft, non-tender,  non-distended MUSCULOSKELETAL:  No edema; No deformity  SKIN: Warm and dry NEUROLOGIC:  Alert and oriented x 3 PSYCHIATRIC:  Normal affect   ASSESSMENT:    1. Essential hypertension, benign   2. Aneurysm of ascending aorta without rupture   3. Aortic valve sclerosis   4. Prediabetes   5. Mixed hyperlipidemia    PLAN:    In order of problems listed above:  HTN:  Blood pressure has been elevated, with recent readings as high as 200/100 mmHg, but is currently well-controlled.  In fact her diastolic blood pressure is fairly low.  Anxiety about blood pressure readings may contribute to elevated readings. Current medications: Amlodipine, hydrochlorothiazide  and olmesartan , are long-acting and should not cause significant fluctuations.  I think she should continue them.  No evidence of renal artery stenosis on imaging.  Try to relax before checking blood pressure and important to be aggressive  at least 10 minutes in a sitting position before checking.  Advised to seek emergency care if experiencing severe headache, chest pain, difficulty breathing, or neurological symptoms. Aneurysm of ascending aorta: Ascending aorta is actually not frankly dilated (3.9 cm), but just appears substantially larger compared to her descending thoracic aorta.  There is no coarctation.  It has been stable over time. No evidence of atherosclerosis or renal artery stenosis on imaging. Previous echocardiogram showed no significant valve problems.  Continue annual monitoring of the aorta with imaging.  If he remains stable in another year, I would recommend extending imaging interval to every 2-3 years. Murmur: Quite faint.  No major abnormalities on previous echocardiogram.  Suspect this is due to aortic sclerosis. preDM/HLP: Hemoglobin A1c is elevated at 6.3% and total cholesterol is elevated at 214, triglycerides 158.  On the other hand she has an excellent HDL at 84 and the LDL is only 103.  There is no visible coronary or  aortic atherosclerosis on multiple serial studies.  Would focus on improving glucose levels/insulin sensitivity by losing weight, exercising regularly and avoiding sweets and starches with high glycemic index.  Medication does not appear indicated at this time.          Medication Adjustments/Labs and Tests Ordered: Current medicines are reviewed at length with the patient today.  Concerns regarding medicines are outlined above.  Orders Placed This Encounter  Procedures   EKG 12-Lead   No orders of the defined types were placed in this encounter.   Patient Instructions  Medication Instructions:  No changes *If you need a refill on your cardiac medications before your next appointment, please call your pharmacy*  Lab Work: None ordered If you have labs (blood work) drawn today and your tests are completely normal, you will receive your results only by: MyChart Message (if you have MyChart) OR A paper copy in the mail If you have any lab test that is abnormal or we need to change your treatment, we will call you to review the results.  Testing/Procedures: None ordered  Follow-Up: At [facility], you and your health needs are our priority.  As part of our continuing mission to provide you with exceptional heart care, our providers are all part of one team.  This team includes your primary Cardiologist (physician) and Advanced Practice Providers or APPs (Physician Assistants and Nurse Practitioners) who all work together to provide you with the care you need, when you need it.  Your next appointment:    Follow up as needed  Provider:   Dr Francyne  We recommend signing up for the patient portal called MyChart.  Sign up information is provided on this After Visit Summary.  MyChart is used to connect with patients for Virtual Visits (Telemedicine).  Patients are able to view lab/test results, encounter notes, upcoming appointments, etc.  Non-urgent messages can be sent to  your provider as well.   To learn more about what you can do with MyChart, go to [email or web address].      Signed, Jerel Francyne, MD  01/15/2024 6:08 PM    [facility]

## 2024-01-15 ENCOUNTER — Encounter: Payer: Self-pay | Admitting: Cardiovascular Disease

## 2024-01-23 DIAGNOSIS — E7800 Pure hypercholesterolemia, unspecified: Secondary | ICD-10-CM | POA: Diagnosis not present

## 2024-01-23 DIAGNOSIS — E1169 Type 2 diabetes mellitus with other specified complication: Secondary | ICD-10-CM | POA: Diagnosis not present

## 2024-01-23 DIAGNOSIS — I10 Essential (primary) hypertension: Secondary | ICD-10-CM | POA: Diagnosis not present

## 2024-01-26 ENCOUNTER — Encounter: Payer: Self-pay | Admitting: Family

## 2024-01-26 ENCOUNTER — Ambulatory Visit: Admitting: Family

## 2024-01-26 VITALS — BP 132/72 | HR 55 | Temp 98.5°F | Resp 16 | Ht 62.0 in

## 2024-01-26 DIAGNOSIS — I10 Essential (primary) hypertension: Secondary | ICD-10-CM

## 2024-01-26 DIAGNOSIS — Z7689 Persons encountering health services in other specified circumstances: Secondary | ICD-10-CM

## 2024-01-26 DIAGNOSIS — R7303 Prediabetes: Secondary | ICD-10-CM | POA: Diagnosis not present

## 2024-01-26 MED ORDER — TRUE METRIX BLOOD GLUCOSE TEST VI STRP: 1.0000 | ORAL_STRIP | Freq: Three times a day (TID) | 12 refills | Status: AC

## 2024-01-26 MED ORDER — TRUE METRIX METER W/DEVICE KIT: 1.0000 | PACK | Freq: Three times a day (TID) | 0 refills | Status: AC

## 2024-01-26 MED ORDER — TRUEPLUS LANCETS 28G MISC: 1.0000 | Freq: Three times a day (TID) | 12 refills | Status: AC

## 2024-01-26 NOTE — Progress Notes (Signed)
 New patient appointment Concerns with blood pressure elevation/ HTN.   Uses the one touch test strips on an as needed basis.  No dx of DM or prediabetes.

## 2024-01-26 NOTE — Progress Notes (Signed)
 Subjective:    DAINE CROKER - 73 y.o. female MRN [id number]  Date of birth: [birth year]  HPI  NYILAH KIGHT is to establish care.  Current issues and/or concerns: - Established with Cardiology for high blood pressure and related chronic conditions. States homes blood pressures are high. She does not have blood pressure monitor today in office for comparison. She does not complain of red flag symptoms such as but not limited to chest pain, shortness of breath, worst headache of life, nausea/vomiting.  - Prediabetes. States needs refills of diabetic testing supplies to check blood sugars as needed at home.   ROS per HPI   Health Maintenance:  Health Maintenance Due  Topic Date Due   Zoster Vaccines- Shingrix (1 of 2) 06/18/2001   Diabetic kidney evaluation - Urine ACR  12/18/2008   OPHTHALMOLOGY EXAM  06/24/2015   FOOT EXAM  04/15/2016   HEMOGLOBIN A1C  04/16/2016   DTaP/Tdap/Td (3 - Tdap) 08/29/2018   Mammogram  10/13/2018     Past Medical History: Patient Active Problem List   Diagnosis Date Noted   Decreased estrogen level 12/10/2023   Hypertension 12/10/2023   Peripheral edema 10/15/2015   Ascending aortic aneurysm 08/21/2013   Osteopenia 09/24/2008   HYPERCHOLESTEROLEMIA 08/28/2008   Diabetes mellitus (HCC) 11/14/2007   Essential hypertension, benign 11/14/2007   CARDIAC MURMUR, BENIGN 11/14/2007      Social History   reports that she has never smoked. She has never been exposed to tobacco smoke. She has never used smokeless tobacco. She reports current alcohol use. She reports that she does not use drugs.   Family History  family history includes Arthritis in her mother; Coronary artery disease in her father; Deep vein thrombosis in her father; Hypertension in her father and mother.   Medications: reviewed and updated   Objective:   Physical Exam BP 132/72   Pulse (!) 55   Temp 98.5 F (36.9 C) (Oral)   Resp 16   Ht 5' 2 (1.575 m)   SpO2 98%   BMI  30.33 kg/m   Physical Exam HENT:     Head: Normocephalic and atraumatic.     Nose: Nose normal.     Mouth/Throat:     Mouth: Mucous membranes are moist.     Pharynx: Oropharynx is clear.  Eyes:     Extraocular Movements: Extraocular movements intact.     Conjunctiva/sclera: Conjunctivae normal.     Pupils: Pupils are equal, round, and reactive to light.  Cardiovascular:     Rate and Rhythm: Bradycardia present.     Pulses: Normal pulses.     Heart sounds: Normal heart sounds.  Pulmonary:     Effort: Pulmonary effort is normal.     Breath sounds: Normal breath sounds.  Musculoskeletal:        General: Normal range of motion.     Cervical back: Normal range of motion and neck supple.  Neurological:     General: No focal deficit present.     Mental Status: She is alert and oriented to person, place, and time.  Psychiatric:        Mood and Affect: Mood normal.        Behavior: Behavior normal.       Assessment & Plan:  1. Encounter to establish care (Primary) - Patient presents today to establish care. During the interim follow-up with primary provider as scheduled.  - Return for annual physical examination, labs, and health maintenance. Arrive fasting meaning having  no food for at least 8 hours prior to appointment. You may have only water or black coffee. Please take scheduled medications as normal.  2. Primary hypertension - Blood pressure normal today in office.  - Continue present management as managed per established Cardiology.  - Counseled on blood pressure goal of less than 130/80, low-sodium, DASH diet, medication compliance, and 150 minutes of moderate intensity exercise per week as tolerated. Counseled on medication adherence and adverse effects. - Keep all scheduled appointments with established Cardiology. Please bring home blood pressure monitor to next office visit for comparison.  3. Prediabetes - Patient declined hemoglobin A1c lab. - Diabetic testing  supplies as prescribed.  - Follow-up with primary provider as scheduled.  - glucose blood (TRUE METRIX BLOOD GLUCOSE TEST) test strip; 1 each by Other route 4 (four) times daily -  before meals and at bedtime. Use as instructed  Dispense: 100 each; Refill: 12 - Blood Glucose Monitoring Suppl (TRUE METRIX METER) w/Device KIT; 1 each by Other route 4 (four) times daily -  before meals and at bedtime. Use as directed  Dispense: 1 kit; Refill: 0 - TRUEplus Lancets 28G MISC; 1 each by Other route 4 (four) times daily -  before meals and at bedtime. Use as directed  Dispense: 100 each; Refill: 12   Patient was given clear instructions to go to Emergency Department or return to medical center if symptoms don't improve, worsen, or new problems develop.The patient verbalized understanding.  I discussed the assessment and treatment plan with the patient. The patient was provided an opportunity to ask questions and all were answered. The patient agreed with the plan and demonstrated an understanding of the instructions.   The patient was advised to call back or seek an in-person evaluation if the symptoms worsen or if the condition fails to improve as anticipated.   Greig Chute, NP 01/26/2024, 3:33 PM Primary Care at [facility]

## 2025-01-29 ENCOUNTER — Encounter: Admitting: Family
